# Patient Record
Sex: Female | Born: 1970 | Race: White | Hispanic: No | Smoking: Former smoker
Health system: Southern US, Community
[De-identification: ages and names within clinical notes are randomized; demographics above are authoritative.]

## PROBLEM LIST (undated history)

## (undated) DIAGNOSIS — R51 Headache: Secondary | ICD-10-CM

## (undated) DIAGNOSIS — I739 Peripheral vascular disease, unspecified: Secondary | ICD-10-CM

## (undated) DIAGNOSIS — D689 Coagulation defect, unspecified: Secondary | ICD-10-CM

## (undated) DIAGNOSIS — R32 Unspecified urinary incontinence: Secondary | ICD-10-CM

## (undated) DIAGNOSIS — R519 Headache, unspecified: Secondary | ICD-10-CM

## (undated) DIAGNOSIS — I82409 Acute embolism and thrombosis of unspecified deep veins of unspecified lower extremity: Secondary | ICD-10-CM

## (undated) DIAGNOSIS — K5792 Diverticulitis of intestine, part unspecified, without perforation or abscess without bleeding: Secondary | ICD-10-CM

## (undated) HISTORY — DX: Unspecified urinary incontinence: R32

## (undated) HISTORY — DX: Diverticulitis of intestine, part unspecified, without perforation or abscess without bleeding: K57.92

## (undated) HISTORY — DX: Peripheral vascular disease, unspecified: I73.9

## (undated) HISTORY — PX: EYE SURGERY: SHX253

## (undated) HISTORY — DX: Coagulation defect, unspecified: D68.9

## (undated) HISTORY — PX: VEIN BYPASS SURGERY: SHX833

---

## 2004-11-16 ENCOUNTER — Encounter: Admission: RE | Admit: 2004-11-16 | Discharge: 2004-11-16 | Payer: Self-pay | Admitting: Obstetrics and Gynecology

## 2009-10-25 ENCOUNTER — Ambulatory Visit: Payer: Self-pay | Admitting: Vascular Surgery

## 2009-12-08 ENCOUNTER — Ambulatory Visit: Payer: Self-pay | Admitting: Vascular Surgery

## 2009-12-13 ENCOUNTER — Ambulatory Visit: Payer: Self-pay | Admitting: Vascular Surgery

## 2010-01-24 ENCOUNTER — Ambulatory Visit: Payer: Self-pay | Admitting: Vascular Surgery

## 2010-08-01 ENCOUNTER — Ambulatory Visit: Payer: Self-pay | Admitting: Vascular Surgery

## 2010-08-17 ENCOUNTER — Ambulatory Visit: Payer: Self-pay | Admitting: Vascular Surgery

## 2010-08-21 ENCOUNTER — Ambulatory Visit: Payer: Self-pay | Admitting: Vascular Surgery

## 2010-09-28 ENCOUNTER — Ambulatory Visit
Admission: RE | Admit: 2010-09-28 | Discharge: 2010-09-28 | Payer: Self-pay | Source: Home / Self Care | Attending: Vascular Surgery | Admitting: Vascular Surgery

## 2010-10-16 ENCOUNTER — Ambulatory Visit (INDEPENDENT_AMBULATORY_CARE_PROVIDER_SITE_OTHER): Payer: Medicaid - Out of State | Admitting: Vascular Surgery

## 2010-10-16 ENCOUNTER — Encounter (INDEPENDENT_AMBULATORY_CARE_PROVIDER_SITE_OTHER): Payer: Medicaid - Out of State

## 2010-10-16 DIAGNOSIS — I83893 Varicose veins of bilateral lower extremities with other complications: Secondary | ICD-10-CM

## 2010-10-16 DIAGNOSIS — M79609 Pain in unspecified limb: Secondary | ICD-10-CM

## 2010-10-17 NOTE — Assessment & Plan Note (Signed)
OFFICE VISIT  Shelby, Frederick DOB:  [birth year]                                       10/16/2010 [id number]#:[id number]  This patient returns today continuing to complain of aching up into her right buttock area extending down the right lateral thigh.  She has noticed some prominent veins in this area and is concerned that the venous disease may be the problem.  We performed a laser ablation of the right anterior accessory branch of the great saphenous vein 08/17/2010. She previously had laser ablation of her right great saphenous vein in [place] in the past.  She has not noted any new bulging varicosities. She does not have severe symptoms in the left leg.  PHYSICAL EXAMINATION:  Her blood pressure is 133/87, heart rate is 91, respirations 24.  General:  She is a well-developed obese female who is in no apparent distress, alert and oriented x3.  Abdomen:  Soft, nontender with no masses.  Lower extremity exam:  Reveals 3+ femoral, popliteal, and dorsalis pedis pulses bilaterally.  She does not have any bulging varicosities in the right leg.  She does have a few prominent veins over the right buttock area laterally but not really varicosed.  I ordered a venous duplex exam which I reviewed and interpreted.  She does have some patency of the accessory branch of the right great saphenous vein which we previously closed but no DVT or patency of the right great saphenous vein itself.  I do not think venous disease is causing her current symptoms.  It sounds more like either nerve compression symptoms or orthopedic type joint symptoms.  I think she would best be evaluated by orthopedic surgeon and have asked her to make an appointment with Dr. Annell Greening to evaluate this problem.  I do not think any further venous treatment is indicated and I discussed that at length with her.    [name], M.D. Electronically Signed  JDL/MEDQ  D:  10/16/2010  T:   10/17/2010  Job:  [id number]

## 2010-10-31 NOTE — Procedures (Unsigned)
DUPLEX DEEP VENOUS EXAM - LOWER EXTREMITY  INDICATION:  Increased pain.  HISTORY:  Edema:  No. Trauma/Surgery:  Right anterior saphenous vein laser ablation on 08/17/2010, history of bilateral greater saphenous vein laser ablation. Pain:  Yes. PE:  No. Previous DVT:  Yes. Anticoagulants: Other:  DUPLEX EXAM:               CFV   SFV   PopV  PTV    GSV               R  L  R  L  R  L  R   L  R  L Thrombosis    o  o  o  o  o  o  o   o Spontaneous   +  +  +  +  +  +  +   + Phasic        +  +  +  +  +  +  +   + Augmentation  +  +  +  +  +  +  +   + Compressible  +  +  +  +  +  +  +   + Competent     +  o  +  o  +  +  +   +  Legend:  + - yes  o - no  p - partial  D - decreased  IMPRESSION: 1. No evidence of deep vein thrombosis noted in the bilateral lower     extremities. 2. The bilateral greater saphenous veins were not adequately     visualized.  This is due to patient's history of bilateral greater     saphenous vein laser ablations. 3. The right anterior accessory saphenous vein appears partially     occluded, with reflux of >500 milliseconds, at the proximal to mid     thigh levels.  There is a patent tortuous superficial varicose vein     that communicates with the left anterior accessory vein at the mid     thigh level and courses to the lateral hip region with  retrograde     flow. 4. The left anterior accessory saphenous vein, with a  tortuous     proximal thigh segment, demonstrates reflux of >500 milliseconds. 5. The left common femoral and superficial femoral veins demonstrate     reflux of >500 milliseconds. 6. Additional information and measurements are noted on the attached     work sheet.     _____________________________ Shelby Frederick, M.D.  CH/MEDQ  D:  10/17/2010  T:  10/17/2010  Job:  [id number]

## 2011-01-16 NOTE — Procedures (Signed)
LOWER EXTREMITY VENOUS REFLUX EXAM   INDICATION:  History of thrombophlebitis in right leg, pain.   EXAM:  Using color-flow imaging and pulse Doppler spectral analysis, the  right common femoral, superficial femoral, popliteal, posterior tibial,  greater and lesser saphenous veins were evaluated.  There is no evidence  suggesting deep venous insufficiency in the right lower extremity.   The right saphenofemoral junction is competent.  The right GSV is not  visible and has been previously lasered closed.   The right proximal short saphenous vein is not visualized and has been  previously lasered closed.   The anterior lateral branch noted with focal proximal thigh reflux >500  milliseconds noted with diameters that range from 0.55 to 0.35 cm.   GSV Diameter (used if found to be incompetent only)                                            Right    Left  Proximal Greater Saphenous Vein           cm       cm  Proximal-to-mid-thigh                     cm       cm  Mid thigh                                 cm       cm  Mid-distal thigh                          cm       cm  Distal thigh                              cm       cm  Knee                                      cm       cm   IMPRESSION:  1. The right greater saphenous vein is not visualized and has been      previously lasered closed.  The deep venous system is competent.      The right short saphenous vein is not visualized and has been      previously lasered closed.  2. Anterior lateral branch noted with focal proximal thigh reflux >500      milliseconds with diameters as noted above.  Nonocclusive      superficial phlebitis noticed in the distal thigh.         ___________________________________________  Quita Skye. Hart [place], M.D.   NT/MEDQ  D:  08/01/2010  T:  08/01/2010  Job:  [id number]

## 2011-01-16 NOTE — Procedures (Signed)
DUPLEX DEEP VENOUS EXAM - LOWER EXTREMITY   INDICATION:  Rule out thrombophlebitis.   HISTORY:  Edema:  yes  Trauma/Surgery:  The patient fell on 12/07/2009  Pain:  yes  PE:  no  Previous DVT:  Left leg  Anticoagulants:  no  Other:   DUPLEX EXAM:                CFV   SFV   PopV  PTV    GSV                R  L  R  L  R  L  R   L  R         L  Thrombosis    0  0  0     0     0      Stripped spontaneous  Spontaneous   +  +  +  +  +  +  +   +  +         +  Phasic        +  +  +  +  +  +  +   +  +         +  Augmentation  Compressible  Competent   Legend:  + - yes  o - no  p - partial  D - decreased   IMPRESSION:  There does not appear to be any deep vein thrombus noted in  the right leg.  There does appear to be thrombus noted in the lateral  branch off of the right saphenous vein origin.  It was thrombosed at  midthigh level to the knee level.    _____________________________  Di Kindle. Edilia Bo, M.D.   CB/MEDQ  D:  12/09/2009  T:  12/09/2009  Job:  [id number]

## 2011-01-16 NOTE — Consult Note (Signed)
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  Her  venous history began 6 years ago when she developed thrombophlebitis in  the right calf during her pregnancy.  This resolved.  Following the  pregnancy she developed varicose veins in the right leg which worsened  and in 2009 she developed a second episode of thrombophlebitis in the  right posterior calf, was treated with Coumadin for 6 months and that  resolved.  She has had painful varicosities which were treated in  [place], [place], with laser ablation of both great saphenous veins in  March of 2010.  She states that the pain in the right leg persisted and  that she developed a deep clot in the left femoral vein which required a  hospitalization and conversion to Lovenox and Coumadin therapy which was  continued for 3 months.  She then had evaluation at [facility]  where she was found to have reflux in her right small saphenous vein and  had a laser ablation performed by Dr. Margit Hanks in August of 2010.  She  continues to have painful varicosities in the right thigh anteriorly  medially which cause aching, burning and throbbing discomfort as she is  on her feet.  She has swelling in the right ankle as the day progresses  and her symptoms worsen and she develops hypersensitivity below the knee  and down to the ankle area.  She has had no deep venous thrombosis in  the right leg.  No bleeding, no ulceration, does wear short leg  compression stockings and elevate her legs periodically.   PAST MEDICAL HISTORY:  Chronic medical problems none.  Negative for  diabetes, coronary artery disease, hypertension, COPD or stroke.   FAMILY HISTORY:  Positive for coronary artery disease in two  grandfathers, negative for diabetes and stroke.   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  Does not use alcohol.   REVIEW OF SYSTEMS:  Positive for leg discomfort with walking and history  of DVT and phlebitis as noted before.  No chest pain, dyspnea on  exertion, PND, orthopnea.  No GI or GU symptoms.  All other systems  negative on review of systems.   PHYSICAL EXAMINATION:  Vital signs:  Blood pressure 128/83, heart rate  90, respirations 14.  General:  She is a well-developed, well-nourished  obese female who is in no apparent distress.  She is alert and oriented  x3.  HEENT:  EOMs intact.  Conjunctive normal.  Neck:  Supple, 3+  carotid pulses.  No bruits.  Lungs:  Clear to auscultation.  Cardiovascular:  Regular rhythm.  No murmurs.  Abdomen:  Soft, nontender  with no masses.  Musculoskeletal:  Free of major deformities.  Neurological:  Normal.  Skin:  Free of rashes.  She has bulging  varicosities in the right leg in the mid to distal thigh anteriorly and  extending medially down to the knee with 1+ edema in the right ankle.  No varicosities noted  in the left leg.   I ordered a venous duplex exam today which I reviewed and interpreted.  This reveals that the right great saphenous vein has been totally closed  but there is a large lateral accessory branch which runs from the  saphenofemoral junction to the knee, which does have gross reflux  feeding these varicosities.  Left leg has a similar branch extending  down about halfway down the thigh with some reflux.  There is no reflux  in the small saphenous systems bilaterally and the deep systems are  normal with no DVT.   I think the best plan for this lady would be to treat her conservatively  for 3 months with long leg elastic compression stockings, elevation,  ibuprofen on a daily basis.  If she has had no improvement we  should  proceed with laser ablation of this lateral accessory branch with  multiple stab phlebectomies.  She will return in 3 months.     [name], M.D.  Electronically Signed   JDL/MEDQ  D:  10/25/2009  T:  10/26/2009  Job:  [id number]

## 2011-01-16 NOTE — Assessment & Plan Note (Signed)
OFFICE VISIT   Shelby Frederick, Shelby Frederick  DOB:  1971-01-24                                       08/01/2010  HYQMV#:[id number]   The patient returns today with worsening symptoms from her venous  insufficiency in the right leg.  Previously she had been known to have  severe reflux in the lateral anterior accessory branch of the right  great saphenous vein and was prepared for laser ablation when she  developed thrombophlebitis and that thrombosed in April of this year.  She now has been wearing long-leg elastic compression stockings but has  worsening aching, throbbing and burning discomfort along the medial  thigh.  She states she has had two episodes of superficial  thrombophlebitis since we saw her in May which she has treated with warm  compresses and ibuprofen but continues to have symptoms.  She also  elevates her leg and takes ibuprofen on a regular basis.   On examination today she has some tender varicosities in the medial  aspect of the distal right thigh and some tender reticular veins  associated with this area.  She has 1+ edema in the lower leg.  She has  a 3+ dorsalis pedis pulse palpable.   Today I ordered venous duplex exam which I reviewed and interpreted.  I  performed a bedside SonoSite ultrasound exam.  She has recanalized her  anterior accessory branch of the right great saphenous vein which has  clear reflux from the junction down to the mid thigh level communicating  with these tender areas.  Her deep system is normal.   I think this patient should have:  Laser ablation of her right lateral  accessory branch of the great saphenous vein which is responsible for  the painful varicosities to be followed by one course of sclerotherapy  in the distal thigh.  We will proceed with this in the near future to  try to relieve the symptoms of this patient whose symptoms are currently  affecting her daily living despite conservative measures.  Blood  pressure today is 122/80, heart rate 78, respirations 14.     Quita Skye. Hart [place], M.D.  Electronically Signed   JDL/MEDQ  D:  08/01/2010  T:  08/01/2010  Job:  [id number]

## 2011-01-16 NOTE — Procedures (Signed)
DUPLEX DEEP VENOUS EXAM - LOWER EXTREMITY   INDICATION:  Follow up right lower extremity laser ablation.   HISTORY:  Edema:  No.  Trauma/Surgery:  Right anterior accessory saphenous laser ablation on  08/17/2010, history of prior right lower extremity vein procedure.  Pain:  Yes.  PE:  No.  Previous DVT:  Yes.  Anticoagulants:  Other:   DUPLEX EXAM:                CFV   SFV   PopV  PTV    GSV                R  L  R  L  R  L  R   L  R  L  Thrombosis    o  o  o     o     o      +  Spontaneous   +  +  +     +     +      o  Phasic        +  +  +     +     +      o  Augmentation  +  +  +     +     +      o  Compressible  +  +  +     +     +      o  Competent     +  o  +     o     +      o   Legend:  + - yes  o - no  p - partial  D - decreased   IMPRESSION:  1. No evidence of deep venous thrombosis noted in the right lower      extremity.  2. The right greater saphenous vein demonstrates evidence of a past      laser ablation.  3. The right proximal thigh anterior accessory vein appears to be      totally occluded from the mid thigh to near the saphenofemoral      junction.  There is a patent branch with retrograde flow noted at      the right mid thigh level anterior accessory saphenous vein which      appears to possibly communicate with a superior branch near the      saphenofemoral junction, as described on the attached work sheet.    _____________________________  Quita Skye. Hart [place], M.D.   CH/MEDQ  D:  08/22/2010  T:  08/22/2010  Job:  [id number]

## 2011-01-16 NOTE — Assessment & Plan Note (Signed)
OFFICE VISIT   KORENE, DULA  DOB:  [birth year]                                       08/21/2010  JYNWG#:[id number]   The patient returns for followup regarding her recent laser ablation  procedure performed December 15.  She had previously had her great  saphenous vein closed with laser ablation 2 years ago in [place] and had  her right anterior accessory great saphenous vein closed by [place] 1 week  ago.  There was a proximal segment which was feeding the varicosities in  the thigh.  She has had some moderate discomfort along the course of the  lateral and anterior accessory branch and she states that this has been  radiating into the hip area.  She has been taking ibuprofen, wearing her  long-leg elastic compression stocking and trying ice compression.  She  has had no distal edema in the right leg.   PHYSICAL EXAMINATION:  On examination today blood pressure is 112/80,  heart rate 64, respirations 24.  She has some mild to moderate  discomfort along the course of the anterior accessory branch.  There is  no ecchymosis or hematoma noted.  She has 3+ dorsalis pedis pulse with  no edema distally.   Today I ordered a venous duplex exam which I reviewed and interpreted.  There is no evidence of deep venous obstruction in the right leg.  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  There are some  collateral branches around the saphenofemoral junction extending out  laterally which do have reflux but are not in a long enough segment to  close with laser.   I reassured her regarding these findings.  She will return in about 6  weeks for her course of sclerotherapy to conclude her treatment session.     [name], M.D.  Electronically Signed   JDL/MEDQ  D:  08/21/2010  T:  08/21/2010  Job:  [id number]

## 2011-01-16 NOTE — Procedures (Signed)
LOWER EXTREMITY VENOUS REFLUX EXAM   INDICATION:  Bilateral legs varicose veins with pain and swelling.   EXAM:  Using color-flow imaging and pulse Doppler spectral analysis, the  bilateral common femoral, superficial femoral, popliteal, posterior  tibial, greater and lesser saphenous veins are evaluated.  There is no  evidence suggesting deep venous insufficiency in the right and left  lower extremity.   The right saphenofemoral junction is not competent with reflux of > 500  milliseconds.  The right and left greater saphenous vein appears  ablated.  The right and left saphenofemoral junction appears ablated.   GSV Diameter (used if found to be incompetent only)                                            Right    Left  Proximal Greater Saphenous Vein           cm       cm  Proximal-to-mid-thigh                     cm       cm  Mid thigh                                 cm       cm  Mid-distal thigh                          cm       cm  Distal thigh                              cm       cm  Knee                                      cm       cm   IMPRESSION:  1. Right and left greater saphenous vein appears ablated.  Bilateral      greater saphenous vein lateral branch appears to have reflux of      >500 milliseconds.  2. The right and left greater saphenous vein is not aneurysmal.  3. The right and left greater saphenous veins are not tortuous.  4. The deep venous system is competent.  5. The right and left lesser saphenous veins appear ablated.  6. No evidence of deep venous thrombosis noted in bilateral legs.   Please see attached 4 drawings.         ___________________________________________  [name], M.D.   MG/MEDQ  D:  10/25/2009  T:  10/26/2009  Job:  [id number]

## 2011-01-16 NOTE — Assessment & Plan Note (Signed)
OFFICE VISIT   Shelby, Frederick  DOB:  [birth year]                                       01/24/2010  QMVHQ#:[id number]   The patient returns today for further followup regarding her venous  insufficiency of the right leg.  She was found to have thrombosis of the  lateral branch which we had planned for laser ablation.  This was  discovered at the last visit on April 12, therefore eliminating the need  for the laser treatment.  She does not have any bulging varicosities  that are in need of stab phlebectomies at this time.  Her right leg does  feel better since this is thrombosed but she does have some aching and  throbbing discomfort in the calf and thigh.   I have recommended she wear elastic compression stockings, try elevation  of the legs and take ibuprofen for this.  She also has some early  varicosities in the posterior left calf area.  We evaluated these today.  They are causing some aching, throbbing and burning discomfort.  She has  a palpable dorsalis pedis pulse in the left foot.  She has no greater  saphenous varicosities anteriorly.  Venous duplex today revealed deep  system on the left to be normal with some reflux in the lateral of the  great saphenous vein although the vein is not very large.  I recommended  primary sclerotherapy for this.  If she should decide she would like to  proceed with this she will be in touch with [place] otherwise return on a  p.r.n. basis.     [name], M.D.  Electronically Signed   JDL/MEDQ  D:  01/24/2010  T:  01/25/2010  Job:  [id number]

## 2011-01-16 NOTE — Procedures (Signed)
LOWER EXTREMITY VENOUS REFLUX EXAM   INDICATION:  Painful varicosities.   EXAM:  Using color-flow imaging and pulse Doppler spectral analysis, the  left common femoral, superficial femoral, popliteal, posterior tibial,  greater and lesser saphenous veins are evaluated.  There is no evidence  suggesting deep venous insufficiency in the left lower extremity.   The left saphenofemoral junction is competent. The left GSV is ablated.   The left proximal short saphenous vein demonstrates competency.   GSV Diameter (used if found to be incompetent only)                                            Right    Left  Proximal Greater Saphenous Vein           cm       cm  Proximal-to-mid-thigh                     cm       cm  Mid thigh                                 cm       cm  Mid-distal thigh                          cm       cm  Distal thigh                              cm       cm  Knee                                      cm       cm   IMPRESSION:  1. The left greater saphenous vein appears ablated.  The left lateral      branch appears to have reflux of >500 milliseconds.  The calibers      range from 0.33 to 0.42 cm/s.  2. The deep venous system is competent.  3. The left lesser saphenous veins appear ablated.  Please see      attached drawing.   ___________________________________________  Quita Skye. Hart [place], M.D.   CB/MEDQ  D:  01/24/2010  T:  01/24/2010  Job:  [id number]

## 2011-01-16 NOTE — Assessment & Plan Note (Signed)
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  Her  venous history as regarding her varicosities is very well documented in  Dr. Candie Chroman most recent note.  He has plans to treat her conservatively  for the next 3 months with long-leg elastic compression stockings,  elevation, and ibuprofen on a daily basis.  Plans are that if she has no  improvement, we would proceed with laser ablation of a lateral accessory  branch of multiple stab phlebectomies.  She was scheduled to return in 3  months.   On 12/07/2009, she slipped and fell.  Since that time, she has  complained of increasing redness and inflammation and warmth of the  anterior right thigh.  She contacted our office and requested a visit.   Review of systems is significant for leg discomfort.  All other  interrogatories were negative.   Physical findings revealed a very well-nourished female in no apparent  distress.  HEENT:  PERRLA, EOMI with normal conjunctivae.  Mucous  membranes were pink and moist.  Lungs were clear.  Cardiac exam revealed  a regular rate and rhythm.  The abdomen was soft, nontender.  Musculoskeletal examination revealed no major deformities.  Neurological  exam revealed no focal weaknesses.  Heart rate was 92.  Blood pressure  was initially 154/102.  At the end of the visit, it was down to 150/94.  O2 sat was 98%.   I did evaluate the right lower extremity.  On the anterior portion of  the thigh, there was a very small area which measured approximately 10  cm x 2 cm, which did appear somewhat erythematous.  In this area, there  was a venous cord which was appreciable.   LABORATORY WORK:  She did undergo a venous Doppler, which demonstrated  no deep vein  thrombosis noted in the right leg.  There did appear to be  a thrombosis noted in the lateral branch off the right greater saphenous  vein, which became thrombosed mid thigh level to the knee level.   These findings were discussed with Dr. Edilia Bo.  We felt that this did  not represent a life-threatening problem.  We felt that this would be  best treated with conservative measures, i.e., leg elevation, warm  compresses, and ibuprofen.   I did discuss this treatment plan with patient.  She does agree to  conservative treatment at this time.  I also gave her a note excusing  her from work for the next several days, specifically 04/07 through  04/011, so that she could elevate the extremity and maintained warm  compresses.   She was further informed that if this did not resolve or if she had  further questions, to call and we would be happy to either discuss the  questions with her or see her again.   Wilmon Arms, PA   Di Kindle. Edilia Bo, M.D.  Electronically Signed   KEL/MEDQ  D:  12/08/2009  T:  12/08/2009  Job:  [id number]

## 2011-01-16 NOTE — Assessment & Plan Note (Signed)
OFFICE VISIT   Shelby Frederick, Shelby Frederick  DOB:  May 08, 1971                                       08/17/2010  [id number]#:[id number]   The patient had laser ablation of the lateral accessory branch of the  right great saphenous vein performed under local anesthesia today for  recurrent painful varicosities.  She has never had previous procedures  done in this facility but has had at [facility].  She had reflux in the  proximal lateral accessory  branch.  I was unable to cannulate the  accessory branch in the distal to mid-thigh but was able to cannulate it  proximally.  She tolerated the procedure well and will return in 1 week  for venous duplex exam to check on closure of this lateral accessory  branch.     Shelby Frederick, M.D.  Electronically Signed   JDL/MEDQ  D:  08/17/2010  T:  08/18/2010  Job:  [id number]

## 2011-01-16 NOTE — Assessment & Plan Note (Signed)
OFFICE VISIT   TREINA, ARSCOTT  DOB:  08/09/1971                                       12/13/2009  [id number]#:[id number]   The patient returns again today for further evaluation of her acute  thrombophlebitis of the right leg.  I had seen her in February with a  plan to perform ablation of her right lateral accessory branch of the  great saphenous vein which is feeding painful varicosities in her distal  thigh.  She has previously undergone laser ablation in [place] of both  great saphenous veins and at [facility] of the right small saphenous vein.  However, she developed thrombophlebitis in the right lateral accessory  branch last week and thrombosed that branch and she now has resolving  thrombophlebitis in the right thigh.  She has not worked in 12 months  because of her venous problems with the right leg.  She has been wearing  elastic compression stockings.   On exam today she does have some resolving thrombophlebitis in the right  thigh extending anteriorly and down to the knee level medially.  No  tenderness in the calf.  She had a venous duplex exam performed last  week in the office which I have reviewed today.  She has no deep venous  thrombosis.  She does have thrombosis of the lateral branch of the right  great saphenous vein which involved the distal portion last week.  Today  I performed the bedside SonoSite exam which revealed total occlusion and  thrombosis of the lateral accessory branch up to the saphenofemoral  junction.   There is no flow in the branch which we were planning to perform  ablation on, and therefore no treatment could be done at this time.  I  have recommended she continue with heat, elevation and ibuprofen for  this thrombophlebitis, let it resolve and return to work with elastic  compression stockings.  If she develops further bulging varicosities in  the future we could have her return to recheck this to see if this has  recanalized in the great saphenous system and I discussed that at length  with her today.     [name], M.D.  Electronically Signed   JDL/MEDQ  D:  12/13/2009  T:  12/14/2009  Job:  [id number]

## 2012-06-27 ENCOUNTER — Telehealth: Payer: Self-pay

## 2012-06-27 ENCOUNTER — Encounter (HOSPITAL_COMMUNITY): Payer: Self-pay | Admitting: Emergency Medicine

## 2012-06-27 ENCOUNTER — Emergency Department (HOSPITAL_COMMUNITY)
Admission: EM | Admit: 2012-06-27 | Discharge: 2012-06-27 | Disposition: A | Discharge status: Home / Self Care | Payer: Self-pay | Attending: Emergency Medicine | Admitting: Emergency Medicine

## 2012-06-27 DIAGNOSIS — I82509 Chronic embolism and thrombosis of unspecified deep veins of unspecified lower extremity: Secondary | ICD-10-CM | POA: Insufficient documentation

## 2012-06-27 DIAGNOSIS — M79606 Pain in leg, unspecified: Secondary | ICD-10-CM

## 2012-06-27 DIAGNOSIS — Z86718 Personal history of other venous thrombosis and embolism: Secondary | ICD-10-CM | POA: Insufficient documentation

## 2012-06-27 DIAGNOSIS — F17200 Nicotine dependence, unspecified, uncomplicated: Secondary | ICD-10-CM | POA: Insufficient documentation

## 2012-06-27 DIAGNOSIS — I82409 Acute embolism and thrombosis of unspecified deep veins of unspecified lower extremity: Secondary | ICD-10-CM

## 2012-06-27 HISTORY — DX: Acute embolism and thrombosis of unspecified deep veins of unspecified lower extremity: I82.409

## 2012-06-27 LAB — BASIC METABOLIC PANEL
CO2: 27 mEq/L (ref 19–32)
Calcium: 9.6 mg/dL (ref 8.4–10.5)
Glucose, Bld: 90 mg/dL (ref 70–99)
Potassium: 3.6 mEq/L (ref 3.5–5.1)

## 2012-06-27 LAB — PROTIME-INR: INR: 0.92 (ref 0.00–1.49)

## 2012-06-27 LAB — CBC
HCT: 42.5 % (ref 36.0–46.0)
MCHC: 33.9 g/dL (ref 30.0–36.0)
Platelets: 295 10*3/uL (ref 150–400)
RDW: 13.3 % (ref 11.5–15.5)
WBC: 8.9 10*3/uL (ref 4.0–10.5)

## 2012-06-27 MED ORDER — OXYCODONE-ACETAMINOPHEN 5-325 MG PO TABS
2.0000 | ORAL_TABLET | Freq: Once | ORAL | Status: AC
  Administered 2012-06-27: 2 via ORAL
  Filled 2012-06-27: qty 2

## 2012-06-27 MED ORDER — ENOXAPARIN SODIUM 150 MG/ML ~~LOC~~ SOLN: 1.0000 mg/kg | Freq: Once | SUBCUTANEOUS | Status: DC

## 2012-06-27 MED ORDER — ENOXAPARIN SODIUM 80 MG/0.8ML ~~LOC~~ SOLN
80.0000 mg | Freq: Once | SUBCUTANEOUS | Status: AC
  Administered 2012-06-27: 80 mg via SUBCUTANEOUS
  Filled 2012-06-27: qty 0.8

## 2012-06-27 NOTE — Telephone Encounter (Signed)
Discussed w/ Dr. Imogene Burn.  States pt. needs to be seen by her medical doctor to evaluate her symptoms.  Advised pt. to report symptoms to her PCP.  Verb. Understanding.

## 2012-06-27 NOTE — Telephone Encounter (Signed)
Phone call from pt.  Reports onset of sensation of bee-stings on inner aspect of lower right leg and felt toes drawing-up, during night.   States swelling from the right foot to knee.  States she feels "a lot of pressure".  Reports hx. Of blood clots. Denies taking any anticoagulants at this time.   Will discuss w/ Dr. Imogene Burn.

## 2012-06-27 NOTE — ED Notes (Signed)
Right knee pain for 2 weeks, around 3  am and right toes curled up and felt a sharp pain all over right leg. After that the right calf stated swelling up and area warm to touch.

## 2012-06-27 NOTE — ED Notes (Addendum)
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 2011, sees Dr Hart Rochester with [facility]; pt not currently on blood thinners; strong DP pulse; denies SOB; reports new knot to R knee

## 2012-06-27 NOTE — ED Provider Notes (Signed)
History     CSN: [id number]  Arrival date & time 06/27/12  1839   First MD Initiated Contact with Patient 06/27/12 2044      Chief Complaint  Patient presents with  . Possible DVT     (Consider location/radiation/quality/duration/timing/severity/associated sxs/prior treatment) HPI Comments: 41 y/o female with history of DVT in 2011 presents to the ED complaining of sudden onset right calf pain radiating throughout her leg as a "burning and tingling" sensation. Pain rated 12/10 worse with walking and palpation. No relief with ibuprofen. Admits to mild right leg swelling. States this feels the same as her previous DVT. She is not on any blood thinners at this time. Denies fever, chills, nausea, chest pain, sob, palpitations. Denies any recent long car rides or plane rides. Admits to being a smoker.   The history is provided by the patient.    Past Medical History  Diagnosis Date  . DVT (deep venous thrombosis)     Past Surgical History  Procedure Date  . Vein bypass surgery     History reviewed. No pertinent family history.  History  Substance Use Topics  . Smoking status: Current Every Day Smoker -- 0.5 packs/day    Types: Cigarettes  . Smokeless tobacco: Not on file  . Alcohol Use: No    OB History    Grav Para Term Preterm Abortions TAB SAB Ect Mult Living                  Review of Systems  Constitutional: Negative for fever, chills and diaphoresis.  HENT: Negative for neck pain.   Respiratory: Negative for shortness of breath.   Cardiovascular: Negative for chest pain and palpitations.  Gastrointestinal: Negative for nausea and vomiting.  Musculoskeletal: Positive for myalgias.  Skin: Negative for color change.  Neurological: Negative for weakness and numbness.  Hematological: Does not bruise/bleed easily.  Psychiatric/Behavioral: The patient is not nervous/anxious.     Allergies  Review of patient's allergies indicates no known allergies.  Home  Medications   Current Outpatient Rx  Name Route Sig Dispense Refill  . IBUPROFEN 200 MG PO TABS Oral Take 400 mg by mouth every 6 (six) hours as needed. For pain/fever      BP 155/95  Pulse 102  Temp 98.1 F (36.7 C) (Oral)  Resp 18  SpO2 97%  LMP 06/13/2012  Physical Exam  Constitutional: She is oriented to person, place, and time. She appears well-developed and well-nourished. No distress.  HENT:  Head: Normocephalic and atraumatic.  Eyes: Conjunctivae normal and EOM are normal. Pupils are equal, round, and reactive to light.  Neck: Normal range of motion. Neck supple.  Cardiovascular: Normal rate, regular rhythm, normal heart sounds and intact distal pulses.   Pulses:      Dorsalis pedis pulses are 2+ on the right side, and 2+ on the left side.       Posterior tibial pulses are 2+ on the right side, and 2+ on the left side.       +1 pitting edema right lower leg. Positive Homan's.  Pulmonary/Chest: Effort normal and breath sounds normal. No respiratory distress.  Abdominal: Soft. Bowel sounds are normal. There is no tenderness.  Neurological: She is alert and oriented to person, place, and time. No sensory deficit.  Skin: Skin is warm and dry. No erythema.  Psychiatric: She has a normal mood and affect. Her speech is normal and behavior is normal.    ED Course  Procedures (including critical care  time)  Labs Reviewed - No data to display No results found.   1. Deep vein thrombosis   2. Leg pain       MDM  41 y/o female with probable right leg DVT. Ultrasound is not available at this time of night. Patient given Lovenox and aware to return in the morning for her ultrasound. Pain controlled with percocet. Case discussed with Dr. Adriana Simas who agrees with plan of care.        Trevor Mace, PA-C 06/27/12 2153

## 2012-06-28 ENCOUNTER — Ambulatory Visit (HOSPITAL_COMMUNITY)
Admission: RE | Admit: 2012-06-28 | Discharge: 2012-06-28 | Disposition: A | Discharge status: Home / Self Care | Payer: Self-pay | Source: Ambulatory Visit | Attending: Emergency Medicine | Admitting: Emergency Medicine

## 2012-06-28 DIAGNOSIS — M7989 Other specified soft tissue disorders: Secondary | ICD-10-CM | POA: Insufficient documentation

## 2012-06-28 DIAGNOSIS — M79609 Pain in unspecified limb: Secondary | ICD-10-CM | POA: Insufficient documentation

## 2012-06-28 NOTE — Progress Notes (Signed)
VASCULAR LAB PRELIMINARY  PRELIMINARY  PRELIMINARY  PRELIMINARY  Right lower extremity venous Doppler completed.    Preliminary report:  There is no DVT or SVT noted in the right lower extremity.  Sluggish flow noted in the popliteal vein, etiology unknown.  Roddy Bellamy, 06/28/2012, 9:28 AM

## 2012-06-30 NOTE — ED Provider Notes (Signed)
Medical screening examination/treatment/procedure(s) were conducted as a shared visit with non-physician practitioner(s) and myself.  I personally evaluated the patient during the encounter.  Right calf tenderness. No chest pain or shortness of breath. Subcutaneous Lovenox. Doppler study in morning.  Donnetta Hutching, MD 06/30/12 [phone]

## 2013-01-16 ENCOUNTER — Telehealth: Payer: Self-pay

## 2013-01-16 DIAGNOSIS — I8311 Varicose veins of right lower extremity with inflammation: Secondary | ICD-10-CM

## 2013-01-16 DIAGNOSIS — M79609 Pain in unspecified limb: Secondary | ICD-10-CM

## 2013-01-16 DIAGNOSIS — M7989 Other specified soft tissue disorders: Secondary | ICD-10-CM

## 2013-01-16 NOTE — Telephone Encounter (Signed)
Pt. Called to report onset of discomfort in right lower leg on Tuesday, 5/13, that "felt like a bee sting."  Stated that her "right lower leg felt numb, and had burning in toes."   Since Tuesday, has noticed increase in redness with tenderness in right lower leg.  States "you can see the veins sticking out".  Relates hx of superficial blood clots.  States has swelling from right foot up to knee.  Has been using compression hose, elevating leg, and taking Ibuprofen.  Discussed with Dr. Imogene Burn.  Advises to schedule for a right lower extremity venous duplex next week.  Enc. to continue to use compression, elevation, and Ibuprofen for symptoms.  Appt. given for 11:30 AM 01/19/13.  Pt. aware of appt.  Advised if symptoms worsen over the weekend, should go to the ER.  Verb. Understanding.

## 2013-02-09 ENCOUNTER — Encounter (HOSPITAL_COMMUNITY): Payer: Self-pay | Admitting: Emergency Medicine

## 2013-02-09 ENCOUNTER — Emergency Department (HOSPITAL_COMMUNITY)
Admission: EM | Admit: 2013-02-09 | Discharge: 2013-02-09 | Disposition: A | Discharge status: Home / Self Care | Payer: Medicaid - Out of State | Attending: Emergency Medicine | Admitting: Emergency Medicine

## 2013-02-09 DIAGNOSIS — Z79899 Other long term (current) drug therapy: Secondary | ICD-10-CM | POA: Insufficient documentation

## 2013-02-09 DIAGNOSIS — I82409 Acute embolism and thrombosis of unspecified deep veins of unspecified lower extremity: Secondary | ICD-10-CM | POA: Insufficient documentation

## 2013-02-09 DIAGNOSIS — I82401 Acute embolism and thrombosis of unspecified deep veins of right lower extremity: Secondary | ICD-10-CM

## 2013-02-09 DIAGNOSIS — F17200 Nicotine dependence, unspecified, uncomplicated: Secondary | ICD-10-CM | POA: Insufficient documentation

## 2013-02-09 LAB — POCT I-STAT, CHEM 8
BUN: 14 mg/dL (ref 6–23)
Calcium, Ion: 1.17 mmol/L (ref 1.12–1.23)
Chloride: 106 mEq/L (ref 96–112)
Creatinine, Ser: 0.8 mg/dL (ref 0.50–1.10)
Glucose, Bld: 88 mg/dL (ref 70–99)
HCT: 42 % (ref 36.0–46.0)
Hemoglobin: 14.3 g/dL (ref 12.0–15.0)
Potassium: 3.7 mEq/L (ref 3.5–5.1)
Sodium: 142 mEq/L (ref 135–145)
TCO2: 28 mmol/L (ref 0–100)

## 2013-02-09 LAB — CBC
HCT: 41.5 % (ref 36.0–46.0)
Hemoglobin: 13.6 g/dL (ref 12.0–15.0)
MCHC: 32.8 g/dL (ref 30.0–36.0)
RBC: 4.72 MIL/uL (ref 3.87–5.11)

## 2013-02-09 LAB — PROTIME-INR: INR: 0.86 (ref 0.00–1.49)

## 2013-02-09 MED ORDER — ENOXAPARIN SODIUM 100 MG/ML ~~LOC~~ SOLN
1.0000 mg/kg | Freq: Once | SUBCUTANEOUS | Status: AC
  Administered 2013-02-09: 85 mg via SUBCUTANEOUS
  Filled 2013-02-09: qty 1

## 2013-02-09 MED ORDER — HYDROMORPHONE HCL PF 1 MG/ML IJ SOLN
1.0000 mg | Freq: Once | INTRAMUSCULAR | Status: AC
  Administered 2013-02-09: 1 mg via INTRAMUSCULAR
  Filled 2013-02-09: qty 1

## 2013-02-09 MED ORDER — ENOXAPARIN SODIUM 100 MG/ML ~~LOC~~ SOLN: 1.0000 mg/kg | Freq: Two times a day (BID) | SUBCUTANEOUS | Status: DC

## 2013-02-09 NOTE — ED Notes (Signed)
Pt presenting to ed with c/o right leg with blood clot pt states ongoing x 2 years but she is having worsening pain and swelling today. Pt states her pcp is on vacation this week

## 2013-02-09 NOTE — Progress Notes (Signed)
*  Preliminary Results* Right lower extremity venous duplex completed. Right lower extremity is positive for deep vein thrombosis involving the right peroneal veins. There also appears to be a patent greater saphenous vein from the saphenofemoral junction to the distal thigh, with thrombosis involving a superficial vein of the popliteal fossa as well as a superficial vein of the mid posterior calf. Due to the patient's verbal history of venous ablation, it is uncertain as to whether these are thrombosed varicosities versus greater/lesser saphenous vein thrombosis.  Preliminary results discussed with Johnnette Gourd, PA.  02/09/2013 8:16 PM Gertie Fey, RDMS, RDCS

## 2013-02-09 NOTE — ED Notes (Signed)
Updated patient about when vascular tech was coming to do doppler.  Tech coming from [place], only one covering all hosptials.

## 2013-02-09 NOTE — ED Provider Notes (Signed)
History     CSN: [id number]  Arrival date & time 02/09/13  1508   First MD Initiated Contact with Patient 02/09/13 1626      Chief Complaint  Patient presents with  . r/o blood clot    (Consider location/radiation/quality/duration/timing/severity/associated sxs/prior treatment) HPI Comments: 42 year old female with a past medical history of DVT in the bypass surgery presents to the emergency department complaining of right leg pain x1 month, worsening over the past week. She tried calling Dr. Hart Rochester who is her "vein doctor", however he is on vacation. Pain is severe, constant rated 10 out of 10, worse with walking. It is making her nervous. States her leg appears swollen. Denies chest pain, shortness of breath, palpitations, fever or chills. States this feels exactly the same as when she had a blood clot 2 years ago which is in the same leg. She was on warfarin at that time for 6 months.  The history is provided by the patient.    Past Medical History  Diagnosis Date  . DVT (deep venous thrombosis)     Past Surgical History  Procedure Laterality Date  . Vein bypass surgery      No family history on file.  History  Substance Use Topics  . Smoking status: Current Every Day Smoker -- 0.50 packs/day    Types: Cigarettes  . Smokeless tobacco: Not on file  . Alcohol Use: No    OB History   Grav Para Term Preterm Abortions TAB SAB Ect Mult Living                  Review of Systems  Constitutional: Negative for fever and chills.  Respiratory: Negative for shortness of breath.   Cardiovascular: Positive for leg swelling. Negative for chest pain.  Musculoskeletal:       Positive for right leg pain.  Psychiatric/Behavioral: The patient is nervous/anxious.   All other systems reviewed and are negative.    Allergies  Review of patient's allergies indicates no known allergies.  Home Medications   Current Outpatient Rx  Name  Route  Sig  Dispense  Refill  .  indomethacin (INDOCIN) 50 MG capsule   Oral   Take 50 mg by mouth 3 (three) times daily with meals.         Marland Kitchen oxyCODONE-acetaminophen (PERCOCET/ROXICET) 5-325 MG per tablet   Oral   Take 1 tablet by mouth every 4 (four) hours as needed for pain.         . traMADol (ULTRAM) 50 MG tablet   Oral   Take 50 mg by mouth every 8 (eight) hours as needed for pain.           BP 106/66  Pulse 104  Temp(Src) 98.8 F (37.1 C) (Oral)  Resp 18  SpO2 96%  LMP 01/19/2013  Physical Exam  Nursing note and vitals reviewed. Constitutional: She is oriented to person, place, and time. She appears well-developed and well-nourished. No distress.  HENT:  Head: Normocephalic and atraumatic.  Mouth/Throat: Oropharynx is clear and moist.  Eyes: Conjunctivae and EOM are normal. Pupils are equal, round, and reactive to light.  Neck: Normal range of motion. Neck supple.  Cardiovascular: Regular rhythm, normal heart sounds and intact distal pulses.  Tachycardia present.   Pulses:      Dorsalis pedis pulses are 2+ on the right side.       Posterior tibial pulses are 2+ on the right side.  Pulmonary/Chest: Effort normal and breath sounds normal. No  respiratory distress.  Musculoskeletal: Normal range of motion.       Legs: Right calf tender to palpation throughout, extending into popliteal space and distal posterior leg. Right calf edematous, erythema posteriorly, multiple varicose veins palpable.  Neurological: She is alert and oriented to person, place, and time. She has normal strength. No sensory deficit.  Skin: Skin is warm and dry. She is not diaphoretic.  Psychiatric: She has a normal mood and affect. Her behavior is normal.    ED Course  Procedures (including critical care time)  Labs Reviewed  CBC - Abnormal; Notable for the following:    WBC 11.0 (*)    All other components within normal limits  PROTIME-INR  POCT I-STAT, CHEM 8   No results found.   1. Deep vein thrombosis (DVT),  right       MDM  Possible DVT- obtaining duplex US lower extremity.   Duplex ultrasound results- Right lower extremity is positive for deep vein thrombosis involving the right peroneal veins. There also appears to be a patent greater saphenous vein from the saphenofemoral junction to the distal thigh, with thrombosis involving a superficial vein of the popliteal fossa as well as a superficial vein of the mid posterior calf. Due to the patient's verbal history of venous ablation, it is uncertain as to whether these are thrombosed varicosities versus greater/lesser saphenous vein thrombosis.  First dose of lovenox given in ED, discharged with a prescription for same. She will follow up with Dr. Hart Rochester. Return precautions discussed. Patient states understanding of plan and is agreeable.   Trevor Mace, PA-C 02/10/13 [phone]

## 2013-02-10 ENCOUNTER — Telehealth: Payer: Self-pay | Admitting: *Deleted

## 2013-02-10 NOTE — Telephone Encounter (Signed)
Patient called stating that she had been to the ED 02/09/13 and had been diagnosed with DVT of right leg and could not afford her lovenox.  Patient was last seen by Dr Hart Rochester 08/21/2010.  I instructed patient to call her PCP and schedule an appt today for the management of lovenox/coumadin. [facility] does not have a physician in the office today and I explained that her PCP would be the one to manage her medication.  Patient has an appointment 02/17/13 with Dr Hart Rochester.  Patient verbalized understanding of the instructions and was to call [facility] back if needed.

## 2013-02-10 NOTE — Progress Notes (Signed)
   CARE MANAGEMENT ED NOTE 02/10/2013  Patient:  Shelby Frederick, Shelby Frederick   Account Number:  [id number]  Date Initiated:  02/10/2013  Documentation initiated by:  Fransico Michael  Subjective/Objective Assessment:   seen yesterday in ED at [facility] for c/o leg pain. Diagnosed     Subjective/Objective Assessment Detail:     Action/Plan:   requires medication assistance   Action/Plan Detail:   Anticipated DC Date:  02/09/2013     Status Recommendation to Physician:   Result of Recommendation:      DC Planning Services  [facility]    Choice offered to / List presented to:            Status of service:    ED Comments:   ED Comments Detail:  02/10/13-1445-J.Shahzaib Azevedo,RN,BSN [phone]     Patient noted to be elligible for [facility]. After speaking with  Nicolasa Ducking, CM AD and Brett Canales with [facility], Patient enrolled with MATCH. Phoned patient back and instructed her to come to [facility] pharmacy for medication. Also instructed patient to enroll for patient assistance program for lovenox. And reminded patient again of $3 copay and once a rolling calender year elligibility. No other needs identified.   02/10/13-1434-J.Mycah Formica,RN BSN [phone]      Noted that patient was seen yesterday afternoon at [facility] ED for c/o leg pain. Diagnosed with dvt and discharged home on lovenox. Phoned patient at [phone]. Patient states, "I used to have [place], but they dropped me after my last surgery with Dr. Hart Rochester, I have applied for it again, but it hasn't come thru yet. I can't afford $1500 for medication." Explained MATCH program to patient, including $3 copay and elligibility of once a rolling calendar year. Patient to be assessed for [facility] elligibility.  02/10/13-1427-J.Rylah Fukuda,RN,BSN [phone]     Received call from [employer] regarding patient's request for medication assistance.

## 2013-02-11 ENCOUNTER — Telehealth: Payer: Self-pay | Admitting: *Deleted

## 2013-02-11 ENCOUNTER — Other Ambulatory Visit: Payer: Self-pay | Admitting: *Deleted

## 2013-02-11 DIAGNOSIS — I82401 Acute embolism and thrombosis of unspecified deep veins of right lower extremity: Secondary | ICD-10-CM

## 2013-02-11 NOTE — Telephone Encounter (Signed)
Left a voice mail for this patient (cell phone message box was full so called her home). Told the patient I was following up with her. Since she was seen in the ED and dxed with a DVT, her primary care MD would be the one who would manage her coumadin and there was no need for her to come here 02/17/13 for an ultrasound and to see Dr. Hart Rochester. Dr. Hart Rochester would want to see her in 3 months and repeat the study. I explained that I was therefore cancelling her 6/17 appointment and that Kendal Hymen would be calling her with a new appointment for the study of her right leg and a JDL in 3 months from 02/09/13.

## 2013-02-12 ENCOUNTER — Telehealth: Payer: Self-pay | Admitting: *Deleted

## 2013-02-12 NOTE — Telephone Encounter (Signed)
Clarified to patient the change in her appointment. Discussed treatment of DVT symptoms. Discussed Lovinox and Coumadin. Patient calm and understood. Clarified her future appointment with JDL in Sept. Will follow prn.

## 2013-02-12 NOTE — ED Provider Notes (Signed)
Medical screening examination/treatment/procedure(s) were performed by non-physician practitioner and as supervising physician I was immediately available for consultation/collaboration.  Raeford Razor, MD 02/12/13 [phone]

## 2013-02-16 ENCOUNTER — Ambulatory Visit: Payer: Medicaid - Out of State | Admitting: Vascular Surgery

## 2013-02-17 ENCOUNTER — Ambulatory Visit: Payer: Medicaid - Out of State | Admitting: Vascular Surgery

## 2013-02-20 ENCOUNTER — Emergency Department (HOSPITAL_COMMUNITY)
Admission: EM | Admit: 2013-02-20 | Discharge: 2013-02-20 | Disposition: A | Discharge status: Home / Self Care | Payer: Medicaid - Out of State | Attending: Emergency Medicine | Admitting: Emergency Medicine

## 2013-02-20 ENCOUNTER — Encounter (HOSPITAL_COMMUNITY): Payer: Self-pay | Admitting: Emergency Medicine

## 2013-02-20 DIAGNOSIS — Z9889 Other specified postprocedural states: Secondary | ICD-10-CM | POA: Insufficient documentation

## 2013-02-20 DIAGNOSIS — I809 Phlebitis and thrombophlebitis of unspecified site: Secondary | ICD-10-CM

## 2013-02-20 DIAGNOSIS — I8000 Phlebitis and thrombophlebitis of superficial vessels of unspecified lower extremity: Secondary | ICD-10-CM | POA: Insufficient documentation

## 2013-02-20 DIAGNOSIS — I82401 Acute embolism and thrombosis of unspecified deep veins of right lower extremity: Secondary | ICD-10-CM

## 2013-02-20 DIAGNOSIS — I82409 Acute embolism and thrombosis of unspecified deep veins of unspecified lower extremity: Secondary | ICD-10-CM | POA: Insufficient documentation

## 2013-02-20 DIAGNOSIS — Z7901 Long term (current) use of anticoagulants: Secondary | ICD-10-CM | POA: Insufficient documentation

## 2013-02-20 DIAGNOSIS — F17200 Nicotine dependence, unspecified, uncomplicated: Secondary | ICD-10-CM | POA: Insufficient documentation

## 2013-02-20 DIAGNOSIS — R209 Unspecified disturbances of skin sensation: Secondary | ICD-10-CM | POA: Insufficient documentation

## 2013-02-20 LAB — PROTIME-INR
INR: 0.97 (ref 0.00–1.49)
Prothrombin Time: 12.8 seconds (ref 11.6–15.2)

## 2013-02-20 MED ORDER — NAPROXEN 500 MG PO TABS: 500.0000 mg | ORAL_TABLET | Freq: Two times a day (BID) | ORAL | Status: DC

## 2013-02-20 MED ORDER — ENOXAPARIN SODIUM 150 MG/ML ~~LOC~~ SOLN: 1.0000 mg/kg | Freq: Two times a day (BID) | SUBCUTANEOUS | Status: DC

## 2013-02-20 MED ORDER — HYDROCODONE-ACETAMINOPHEN 5-325 MG PO TABS: 1.0000 | ORAL_TABLET | ORAL | Status: DC | PRN

## 2013-02-20 NOTE — ED Provider Notes (Signed)
History     CSN: [id number]  Arrival date & time 02/20/13  4098   First MD Initiated Contact with Patient 02/20/13 2013      Chief Complaint  Patient presents with  . DVT   HPI  History provided by the patient in recent medical chart. Patient is a 42 year old female with history of previous lower extremity DVTs including a recent diagnosis 11 days ago. Patient had developed pain and soreness in her right lower extremity similar to previous DVTs. She was evaluated in the emergency department found to have a DVT of the right peroneal vein. There was also some question at that time of possible superficial phlebitis however she has had significant varicosity veins do to previous blood clots and changes in her vasculature. Patient states that at that time she initially had some superficial redness and tenderness to the medial calf area just below the mean. Today however she was revisiting the PCP for continued evaluation and she had developed redness in tenderness more proximally to the distal thigh area above the knee. This is a localized area of redness it is tender to palpation. She does continue to have some diffuse swelling of the lower leg and foot. Times she feels tingling in her feet and toes. She denies any other changes or complaints. Denies any fever, chills or sweats. Denies any chest pain, shortness of breath or hemoptysis. Patient has been taking Lovenox twice a day as prescribed every single day since diagnosis.     Past Medical History  Diagnosis Date  . DVT (deep venous thrombosis)     Past Surgical History  Procedure Laterality Date  . Vein bypass surgery      No family history on file.  History  Substance Use Topics  . Smoking status: Current Every Day Smoker -- 0.50 packs/day    Types: Cigarettes  . Smokeless tobacco: Not on file  . Alcohol Use: No    OB History   Grav Para Term Preterm Abortions TAB SAB Ect Mult Living                  Review of Systems    Constitutional: Negative for fever, chills and diaphoresis.  Respiratory: Negative for shortness of breath.   Cardiovascular: Negative for chest pain.  Neurological: Negative for weakness.  All other systems reviewed and are negative.    Allergies  Review of patient's allergies indicates no known allergies.  Home Medications   Current Outpatient Rx  Name  Route  Sig  Dispense  Refill  . enoxaparin (LOVENOX) 100 MG/ML injection   Subcutaneous   Inject 0.85 mLs (85 mg total) into the skin every 12 (twelve) hours.   20 mL   0   . HYDROcodone-acetaminophen (NORCO/VICODIN) 5-325 MG per tablet   Oral   Take 1 tablet by mouth every 8 (eight) hours as needed for pain.           BP 140/90  Pulse 98  Temp(Src) 98.3 F (36.8 C) (Oral)  Resp 16  SpO2 100%  LMP 02/05/2013  Physical Exam  Nursing note and vitals reviewed. Constitutional: She is oriented to person, place, and time. She appears well-developed and well-nourished. No distress.  HENT:  Head: Normocephalic.  Cardiovascular: Normal rate and regular rhythm.   No murmur heard. Pulmonary/Chest: Effort normal and breath sounds normal. No respiratory distress. She has no wheezes. She has no rales.  Abdominal: Soft. There is no tenderness.    There is bruising to the  right lower abdomen consistent with history of recent Lovenox injections. Patient has some linear nodular firmness in this area. The skin is nonerythematous without induration. No signs concerning for abscess infection. There is some suspicion for possible varicose veins in this area.  Musculoskeletal: Normal range of motion. She exhibits edema and tenderness.       Legs: Localized erythema and increased warmth to the right medial distal thigh. There is palpable firmness along superficial veins with tenderness. No erythematous streaks.  Mild diffuse swelling of the lower extremity without erythema or significant increased warmth. Mild to moderate tenderness  along the calf. Normal distal dorsal pedal pulses. Normal sensation in toes and feet. Normal cap refill less than 2 seconds.  Neurological: She is alert and oriented to person, place, and time.  Skin: Skin is warm and dry. No rash noted.  Psychiatric: She has a normal mood and affect. Her behavior is normal.    ED Course  Procedures   Results for orders placed during the hospital encounter of 02/20/13  PROTIME-INR      Result Value Range   Prothrombin Time 12.8  11.6 - 15.2 seconds   INR 0.97  0.00 - 1.49       1. DVT (deep venous thrombosis), right   2. Superficial thrombophlebitis       MDM  8:35PM patient seen and evaluated. Patient well-appearing in no acute distress. Normal respirations and O2 sats. No complaints of chest pain or shortness of breath. No symptoms concerning for possible PE.  Exam consistent with superficial thrombophlebitis. Patient also with recent Doppler ultrasound demonstrating DVT. She is currently taking Lovenox as prescribed.        Angus Seller, PA-C 02/20/13 2228

## 2013-02-20 NOTE — ED Notes (Signed)
Per patient, was recently diagnosed with DVT-right thigh-saw PCP and was told to come to ED because clot has moved

## 2013-02-20 NOTE — ED Notes (Signed)
Pt was seen today by her PCP to follow-up after her DVT dx and get her on a regimen. PCP told pt to come here.

## 2013-02-21 ENCOUNTER — Ambulatory Visit (HOSPITAL_COMMUNITY)
Admission: RE | Admit: 2013-02-21 | Discharge: 2013-02-21 | Disposition: A | Discharge status: Home / Self Care | Payer: Medicaid - Out of State | Source: Ambulatory Visit | Attending: Emergency Medicine | Admitting: Emergency Medicine

## 2013-02-21 DIAGNOSIS — M79609 Pain in unspecified limb: Secondary | ICD-10-CM | POA: Insufficient documentation

## 2013-02-21 DIAGNOSIS — I82B19 Acute embolism and thrombosis of unspecified subclavian vein: Secondary | ICD-10-CM | POA: Insufficient documentation

## 2013-02-21 DIAGNOSIS — I82819 Embolism and thrombosis of superficial veins of unspecified lower extremities: Secondary | ICD-10-CM | POA: Insufficient documentation

## 2013-02-21 NOTE — ED Provider Notes (Signed)
Medical screening examination/treatment/procedure(s) were performed by non-physician practitioner and as supervising physician I was immediately available for consultation/collaboration.  Frandy Basnett, MD 02/21/13 1458 

## 2013-02-21 NOTE — Progress Notes (Signed)
VASCULAR LAB PRELIMINARY  PRELIMINARY  PRELIMINARY  PRELIMINARY  Right lower extremity venous Doppler completed.    Preliminary report:  There is no DVT noted. There is significant SVT noted in the right greater saphenous vein and in varicose veins from the mid calf to the sapheno-femoral junction.  No propagation to the left lower extremity.  Willer Osorno, RVT 02/21/2013, 4:39 PM

## 2013-05-11 ENCOUNTER — Encounter: Payer: Self-pay | Admitting: Vascular Surgery

## 2013-05-12 ENCOUNTER — Encounter: Payer: Self-pay | Admitting: Vascular Surgery

## 2013-05-12 ENCOUNTER — Encounter (INDEPENDENT_AMBULATORY_CARE_PROVIDER_SITE_OTHER): Payer: Medicaid - Out of State | Admitting: *Deleted

## 2013-05-12 ENCOUNTER — Ambulatory Visit (INDEPENDENT_AMBULATORY_CARE_PROVIDER_SITE_OTHER): Payer: Medicaid - Out of State | Admitting: Vascular Surgery

## 2013-05-12 VITALS — BP 144/88 | HR 81 | Resp 16 | Ht 66.0 in | Wt 220.0 lb

## 2013-05-12 DIAGNOSIS — I82401 Acute embolism and thrombosis of unspecified deep veins of right lower extremity: Secondary | ICD-10-CM

## 2013-05-12 DIAGNOSIS — I82409 Acute embolism and thrombosis of unspecified deep veins of unspecified lower extremity: Secondary | ICD-10-CM

## 2013-05-12 DIAGNOSIS — I83893 Varicose veins of bilateral lower extremities with other complications: Secondary | ICD-10-CM | POA: Insufficient documentation

## 2013-05-12 NOTE — Progress Notes (Signed)
Subjective:     Patient ID: Shelby Frederick, female   DOB: 01-29-1971, 42 y.o.   MRN: [id number]  HPI this 42 year old female returns today for followup regarding previous laser ablation in both lower extremities done at different facilities and also a DVT which occurred in June of 2014. She is currently on Coumadin therapy. She had a remote history of DVT in 2001. 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 She develops discomfort in this when she is standing. She has been wearing long leg elastic compression stockings 20-30 mm gradient and trying elevation and ibuprofen for the past 3 months with no improvement. Her edema in the right leg has improved her Coumadin has been well controlled with an INR of 2.3.  Past Medical History  Diagnosis Date  . DVT (deep venous thrombosis)     History  Substance Use Topics  . Smoking status: Current Every Day Smoker -- 0.50 packs/day    Types: Cigarettes  . Smokeless tobacco: Not on file  . Alcohol Use: No    No family history on file.  No Known Allergies  Current outpatient prescriptions:enoxaparin (LOVENOX) 100 MG/ML injection, Inject 0.85 mLs (85 mg total) into the skin every 12 (twelve) hours., Disp: 20 mL, Rfl: 0;  enoxaparin (LOVENOX) 150 MG/ML injection, Inject 0.56 mLs (85 mg total) into the skin every 12 (twelve) hours., Disp: 60 Syringe, Rfl: 0;  HYDROcodone-acetaminophen (NORCO) 5-325 MG per tablet, Take 1 tablet by mouth every 4 (four) hours as needed for pain., Disp: 20 tablet, Rfl: 0 HYDROcodone-acetaminophen (NORCO/VICODIN) 5-325 MG per tablet, Take 1 tablet by mouth every 8 (eight) hours as needed for pain., Disp: , Rfl: ;  naproxen (NAPROSYN) 500 MG tablet, Take 1 tablet (500 mg total) by mouth 2 (two) times daily., Disp: 30 tablet, Rfl: 0  BP 144/88  Pulse 81  Resp 16  Ht 5\' 6"  (1.676 m)  Wt 220 lb (99.791 kg)  BMI 35.53 kg/m2  Body  mass index is 35.53 kg/(m^2).           Review of Systems denies chest pain, dyspnea on exertion, PND, orthopnea, does have a history of pain in legs with walking, pain in feet while lying flat, history of DVT,     Objective:   Physical Exam BP 144/88  Pulse 81  Resp 16  Ht 5\' 6"  (1.676 m)  Wt 220 lb (99.791 kg)  BMI 35.53 kg/m2  General well-developed well-nourished female no apparent stress alert and oriented x3 Lungs no rhonchi or wheezing Right lower extremity with 1+ chronic edema. 3+ dorsalis pedis pulse palpable. Bulging varicosities beginning in the mid anterior thigh extending lateral to the knee and into the lateral pretibial region. Right leg has some similar varicosities but not as prominent in the lateral thigh.  Today I ordered a venous duplex exam of the right leg which are reviewed and interpreted. There is no acute or chronic DVT. There is gross reflux in the anterior accessory branch of the right great saphenous vein which is supplying the bulging varicosities which are symptomatic in the right leg      Assessment:     Venous insufficiency right leg with gross reflux anterior accessory branch right great saphenous vein supplying bulging symptomatic varicosities right thigh and calf History of DVT right leg but no evidence of DVT it present time-on Coumadin    Plan:  Patient needs a laser ablation anterior accessory branch right great saphenous vein with multiple stab phlebectomy is single procedure. Will discontinue Coumadin one week pre-procedure and then resume one week post procedure

## 2018-09-11 NOTE — Patient Instructions (Addendum)
Shelby Frederick  09/11/2018   Your procedure is scheduled on: Tuesday 09/16/2018  Report to [facility] Main  Entrance              Report to admitting at  1130 AM    Call this number if you have problems the morning of surgery [phone]    Remember: Do not eat food  :After Midnight. May have clear liquids from midnight up until 0730 am then nothing until after midnight!    CLEAR LIQUID DIET   Foods Allowed                                                                     Foods Excluded  Coffee and tea, regular and decaf                             liquids that you cannot  Plain Jell-O in any flavor                                             see through such as: Fruit ices (not with fruit pulp)                                     milk, soups, orange juice  Iced Popsicles                                    All solid food Carbonated beverages, regular and diet                                    Cranberry, grape and apple juices Sports drinks like Gatorade Lightly seasoned clear broth or consume(fat free) Sugar, honey syrup  Sample Menu Breakfast                                Lunch                                     Supper Cranberry juice                    Beef broth                            Chicken broth Jell-O                                     Grape juice  Apple juice Coffee or tea                        Jell-O                                      Popsicle                                                Coffee or tea                        Coffee or tea  _____________________________________________________________________               BRUSH YOUR TEETH MORNING OF SURGERY AND RINSE YOUR MOUTH OUT, NO CHEWING GUM CANDY OR MINTS.     Take these medicines the morning of surgery with A SIP OF WATER: Sertraline (Zoloft), Topiramate (Topamax)                                You may not have any metal on your body  including hair pins and              piercings  Do not wear jewelry, make-up, lotions, powders or perfumes, deodorant             Do not wear nail polish.  Do not shave  48 hours prior to surgery.              Do not bring valuables to the hospital. Advance.  Contacts, dentures or bridgework may not be worn into surgery.  Leave suitcase in the car. After surgery it may be brought to your room.                   Please read over the following fact sheets you were given: _____________________________________________________________________             [facility] - Preparing for Surgery Before surgery, you can play an important role.  Because skin is not sterile, your skin needs to be as free of germs as possible.  You can reduce the number of germs on your skin by washing with CHG (chlorahexidine gluconate) soap before surgery.  CHG is an antiseptic cleaner which kills germs and bonds with the skin to continue killing germs even after washing. Please DO NOT use if you have an allergy to CHG or antibacterial soaps.  If your skin becomes reddened/irritated stop using the CHG and inform your nurse when you arrive at Short Stay. Do not shave (including legs and underarms) for at least 48 hours prior to the first CHG shower.  You may shave your face/neck. Please follow these instructions carefully:  1.  Shower with CHG Soap the night before surgery and the  morning of Surgery.  2.  If you choose to wash your hair, wash your hair first as usual with your  normal  shampoo.  3.  After you shampoo, rinse your hair and body thoroughly to remove the  shampoo.  4.  Use CHG as you would any other liquid soap.  You can apply chg directly  to the skin and wash                       Gently with a scrungie or clean washcloth.  5.  Apply the CHG Soap to your body ONLY FROM THE NECK DOWN.   Do not use on face/ open                            Wound or open sores. Avoid contact with eyes, ears mouth and genitals (private parts).                       Wash face,  Genitals (private parts) with your normal soap.             6.  Wash thoroughly, paying special attention to the area where your surgery  will be performed.  7.  Thoroughly rinse your body with warm water from the neck down.  8.  DO NOT shower/wash with your normal soap after using and rinsing off  the CHG Soap.                9.  Pat yourself dry with a clean towel.            10.  Wear clean pajamas.            11.  Place clean sheets on your bed the night of your first shower and do not  sleep with pets. Day of Surgery : Do not apply any lotions/deodorants the morning of surgery.  Please wear clean clothes to the hospital/surgery center.  FAILURE TO FOLLOW THESE INSTRUCTIONS MAY RESULT IN THE CANCELLATION OF YOUR SURGERY PATIENT SIGNATURE_________________________________  NURSE SIGNATURE__________________________________  ________________________________________________________________________   Adam Phenix  An incentive spirometer is a tool that can help keep your lungs clear and active. This tool measures how well you are filling your lungs with each breath. Taking long deep breaths may help reverse or decrease the chance of developing breathing (pulmonary) problems (especially infection) following:  A long period of time when you are unable to move or be active. BEFORE THE PROCEDURE   If the spirometer includes an indicator to show your best effort, your nurse or respiratory therapist will set it to a desired goal.  If possible, sit up straight or lean slightly forward. Try not to slouch.  Hold the incentive spirometer in an upright position. INSTRUCTIONS FOR USE  1. Sit on the edge of your bed if possible, or sit up as far as you can in bed or on a chair. 2. Hold the incentive spirometer in an upright position. 3. Breathe out  normally. 4. Place the mouthpiece in your mouth and seal your lips tightly around it. 5. Breathe in slowly and as deeply as possible, raising the piston or the ball toward the top of the column. 6. Hold your breath for 3-5 seconds or for as long as possible. Allow the piston or ball to fall to the bottom of the column. 7. Remove the mouthpiece from your mouth and breathe out normally. 8. Rest for a few seconds and repeat Steps 1 through 7 at least 10 times every 1-2 hours when you are awake. Take your time and take a few normal breaths between deep breaths. 9. The spirometer may include an indicator to  show your best effort. Use the indicator as a goal to work toward during each repetition. 10. After each set of 10 deep breaths, practice coughing to be sure your lungs are clear. If you have an incision (the cut made at the time of surgery), support your incision when coughing by placing a pillow or rolled up towels firmly against it. Once you are able to get out of bed, walk around indoors and cough well. You may stop using the incentive spirometer when instructed by your caregiver.  RISKS AND COMPLICATIONS  Take your time so you do not get dizzy or light-headed.  If you are in pain, you may need to take or ask for pain medication before doing incentive spirometry. It is harder to take a deep breath if you are having pain. AFTER USE  Rest and breathe slowly and easily.  It can be helpful to keep track of a log of your progress. Your caregiver can provide you with a simple table to help with this. If you are using the spirometer at home, follow these instructions: [place] IF:   You are having difficultly using the spirometer.  You have trouble using the spirometer as often as instructed.  Your pain medication is not giving enough relief while using the spirometer.  You develop fever of 100.5 F (38.1 C) or higher. SEEK IMMEDIATE MEDICAL CARE IF:   You cough up bloody sputum  that had not been present before.  You develop fever of 102 F (38.9 C) or greater.  You develop worsening pain at or near the incision site. MAKE SURE YOU:   Understand these instructions.  Will watch your condition.  Will get help right away if you are not doing well or get worse. Document Released: 12/31/2006 Document Revised: 11/12/2011 Document Reviewed: 03/03/2007 [facility] Patient Information 2014 [place], [place].   ________________________________________________________________________

## 2018-09-15 ENCOUNTER — Encounter (HOSPITAL_COMMUNITY): Payer: Self-pay | Admitting: *Deleted

## 2018-09-15 ENCOUNTER — Other Ambulatory Visit: Payer: Self-pay

## 2018-09-15 ENCOUNTER — Encounter (HOSPITAL_COMMUNITY)
Admission: RE | Admit: 2018-09-15 | Discharge: 2018-09-15 | Disposition: A | Discharge status: Home / Self Care | Payer: Medicare Other | Source: Ambulatory Visit | Attending: Orthopedic Surgery | Admitting: Orthopedic Surgery

## 2018-09-15 DIAGNOSIS — S42232A 3-part fracture of surgical neck of left humerus, initial encounter for closed fracture: Secondary | ICD-10-CM | POA: Diagnosis not present

## 2018-09-15 DIAGNOSIS — Z87891 Personal history of nicotine dependence: Secondary | ICD-10-CM | POA: Diagnosis not present

## 2018-09-15 DIAGNOSIS — G43909 Migraine, unspecified, not intractable, without status migrainosus: Secondary | ICD-10-CM | POA: Diagnosis not present

## 2018-09-15 DIAGNOSIS — S42202A Unspecified fracture of upper end of left humerus, initial encounter for closed fracture: Secondary | ICD-10-CM

## 2018-09-15 DIAGNOSIS — Z86718 Personal history of other venous thrombosis and embolism: Secondary | ICD-10-CM | POA: Diagnosis not present

## 2018-09-15 DIAGNOSIS — M6281 Muscle weakness (generalized): Secondary | ICD-10-CM | POA: Diagnosis not present

## 2018-09-15 DIAGNOSIS — Z01812 Encounter for preprocedural laboratory examination: Secondary | ICD-10-CM | POA: Insufficient documentation

## 2018-09-15 DIAGNOSIS — Z79899 Other long term (current) drug therapy: Secondary | ICD-10-CM | POA: Diagnosis not present

## 2018-09-15 DIAGNOSIS — W1830XA Fall on same level, unspecified, initial encounter: Secondary | ICD-10-CM | POA: Diagnosis not present

## 2018-09-15 DIAGNOSIS — Z7901 Long term (current) use of anticoagulants: Secondary | ICD-10-CM | POA: Diagnosis not present

## 2018-09-15 HISTORY — DX: Headache, unspecified: R51.9

## 2018-09-15 HISTORY — DX: Headache: R51

## 2018-09-15 LAB — BASIC METABOLIC PANEL
Anion gap: 9 (ref 5–15)
BUN: 21 mg/dL — ABNORMAL HIGH (ref 6–20)
CO2: 23 mmol/L (ref 22–32)
Calcium: 9.2 mg/dL (ref 8.9–10.3)
Chloride: 107 mmol/L (ref 98–111)
Creatinine, Ser: 0.77 mg/dL (ref 0.44–1.00)
GFR calc Af Amer: >60 mL/min (ref >60)
GFR calc non Af Amer: >60 mL/min (ref >60)
Glucose, Bld: 100 mg/dL — ABNORMAL HIGH (ref 70–99)
Potassium: 3.7 mmol/L (ref 3.5–5.1)
Sodium: 139 mmol/L (ref 135–145)

## 2018-09-15 LAB — CBC
HCT: 40 % (ref 36.0–46.0)
Hemoglobin: 12.6 g/dL (ref 12.0–15.0)
MCH: 28.7 pg (ref 26.0–34.0)
MCHC: 31.5 g/dL (ref 30.0–36.0)
MCV: 91.1 fL (ref 80.0–100.0)
Platelets: 252 10*3/uL (ref 150–400)
RBC: 4.39 MIL/uL (ref 3.87–5.11)
RDW: 14.3 % (ref 11.5–15.5)
WBC: 10.3 10*3/uL (ref 4.0–10.5)
nRBC: 0 % (ref 0.0–0.2)

## 2018-09-16 ENCOUNTER — Encounter (HOSPITAL_COMMUNITY)
Admission: RE | Disposition: A | Discharge status: Home / Self Care | Payer: Self-pay | Source: Home / Self Care | Attending: Orthopedic Surgery

## 2018-09-16 ENCOUNTER — Ambulatory Visit (HOSPITAL_COMMUNITY): Payer: Medicare Other

## 2018-09-16 ENCOUNTER — Observation Stay (HOSPITAL_COMMUNITY)
Admission: RE | Admit: 2018-09-16 | Discharge: 2018-09-17 | Disposition: A | Discharge status: Home / Self Care | Payer: Medicare Other | Attending: Orthopedic Surgery | Admitting: Orthopedic Surgery

## 2018-09-16 ENCOUNTER — Other Ambulatory Visit: Payer: Self-pay

## 2018-09-16 ENCOUNTER — Encounter (HOSPITAL_COMMUNITY): Payer: Self-pay | Admitting: Anesthesiology

## 2018-09-16 ENCOUNTER — Ambulatory Visit (HOSPITAL_COMMUNITY): Payer: Medicare Other | Admitting: Physician Assistant

## 2018-09-16 ENCOUNTER — Ambulatory Visit (HOSPITAL_COMMUNITY): Payer: Medicare Other | Admitting: Certified Registered Nurse Anesthetist

## 2018-09-16 DIAGNOSIS — M6281 Muscle weakness (generalized): Secondary | ICD-10-CM | POA: Insufficient documentation

## 2018-09-16 DIAGNOSIS — G43909 Migraine, unspecified, not intractable, without status migrainosus: Secondary | ICD-10-CM | POA: Insufficient documentation

## 2018-09-16 DIAGNOSIS — Z87891 Personal history of nicotine dependence: Secondary | ICD-10-CM | POA: Insufficient documentation

## 2018-09-16 DIAGNOSIS — W1830XA Fall on same level, unspecified, initial encounter: Secondary | ICD-10-CM | POA: Insufficient documentation

## 2018-09-16 DIAGNOSIS — S42209A Unspecified fracture of upper end of unspecified humerus, initial encounter for closed fracture: Secondary | ICD-10-CM | POA: Diagnosis present

## 2018-09-16 DIAGNOSIS — Z79899 Other long term (current) drug therapy: Secondary | ICD-10-CM | POA: Insufficient documentation

## 2018-09-16 DIAGNOSIS — Z419 Encounter for procedure for purposes other than remedying health state, unspecified: Secondary | ICD-10-CM

## 2018-09-16 DIAGNOSIS — Z86718 Personal history of other venous thrombosis and embolism: Secondary | ICD-10-CM | POA: Insufficient documentation

## 2018-09-16 DIAGNOSIS — S42232A 3-part fracture of surgical neck of left humerus, initial encounter for closed fracture: Principal | ICD-10-CM | POA: Insufficient documentation

## 2018-09-16 DIAGNOSIS — Z7901 Long term (current) use of anticoagulants: Secondary | ICD-10-CM | POA: Insufficient documentation

## 2018-09-16 HISTORY — PX: ORIF HUMERUS FRACTURE: SHX2126

## 2018-09-16 LAB — POCT I-STAT 4, (NA,K, GLUC, HGB,HCT)
Glucose, Bld: 85 mg/dL (ref 70–99)
HCT: 33 % — ABNORMAL LOW (ref 36.0–46.0)
Hemoglobin: 11.2 g/dL — ABNORMAL LOW (ref 12.0–15.0)
Potassium: 4 mmol/L (ref 3.5–5.1)
Sodium: 140 mmol/L (ref 135–145)

## 2018-09-16 SURGERY — OPEN REDUCTION INTERNAL FIXATION (ORIF) PROXIMAL HUMERUS FRACTURE
Anesthesia: General | Site: Arm Upper | Laterality: Left

## 2018-09-16 MED ORDER — PROMETHAZINE HCL 25 MG/ML IJ SOLN: 6.2500 mg | INTRAMUSCULAR | Status: DC | PRN

## 2018-09-16 MED ORDER — SERTRALINE HCL 100 MG PO TABS: 100.0000 mg | ORAL_TABLET | Freq: Every day | ORAL | Status: DC

## 2018-09-16 MED ORDER — LACTATED RINGERS IV SOLN: INTRAVENOUS | Status: DC

## 2018-09-16 MED ORDER — CHLORHEXIDINE GLUCONATE 4 % EX LIQD: 60.0000 mL | Freq: Once | CUTANEOUS | Status: DC

## 2018-09-16 MED ORDER — PHENOL 1.4 % MT LIQD
1.0000 | OROMUCOSAL | Status: DC | PRN
  Filled 2018-09-16: qty 177

## 2018-09-16 MED ORDER — PROPOFOL 10 MG/ML IV BOLUS
INTRAVENOUS | Status: AC
  Filled 2018-09-16: qty 20

## 2018-09-16 MED ORDER — DEXAMETHASONE SODIUM PHOSPHATE 10 MG/ML IJ SOLN
INTRAMUSCULAR | Status: AC
  Filled 2018-09-16: qty 1

## 2018-09-16 MED ORDER — LACTATED RINGERS IV SOLN
INTRAVENOUS | Status: DC
  Administered 2018-09-16: 1000 mL via INTRAVENOUS
  Administered 2018-09-16: 16:00:00 via INTRAVENOUS

## 2018-09-16 MED ORDER — OXYCODONE HCL 5 MG PO TABS
5.0000 mg | ORAL_TABLET | ORAL | Status: DC | PRN
  Administered 2018-09-16: 5 mg via ORAL
  Filled 2018-09-16: qty 1

## 2018-09-16 MED ORDER — CEFAZOLIN SODIUM-DEXTROSE 2-4 GM/100ML-% IV SOLN: 2.0000 g | INTRAVENOUS | Status: DC

## 2018-09-16 MED ORDER — MENTHOL 3 MG MT LOZG: 1.0000 | LOZENGE | OROMUCOSAL | Status: DC | PRN

## 2018-09-16 MED ORDER — OXYCODONE HCL 5 MG PO TABS: 10.0000 mg | ORAL_TABLET | ORAL | Status: DC | PRN

## 2018-09-16 MED ORDER — SODIUM CHLORIDE 0.9 % IV SOLN
INTRAVENOUS | Status: DC | PRN
  Administered 2018-09-16: 25 ug/min via INTRAVENOUS

## 2018-09-16 MED ORDER — ALBUMIN HUMAN 5 % IV SOLN
INTRAVENOUS | Status: DC | PRN
  Administered 2018-09-16: 16:00:00 via INTRAVENOUS

## 2018-09-16 MED ORDER — HYDROMORPHONE HCL 1 MG/ML IJ SOLN
INTRAMUSCULAR | Status: AC
  Filled 2018-09-16: qty 1

## 2018-09-16 MED ORDER — DIPHENHYDRAMINE HCL 12.5 MG/5ML PO ELIX: 12.5000 mg | ORAL_SOLUTION | ORAL | Status: DC | PRN

## 2018-09-16 MED ORDER — HYDROMORPHONE HCL 1 MG/ML IJ SOLN: 0.5000 mg | INTRAMUSCULAR | Status: DC | PRN

## 2018-09-16 MED ORDER — FENTANYL CITRATE (PF) 100 MCG/2ML IJ SOLN
50.0000 ug | Freq: Once | INTRAMUSCULAR | Status: AC
  Administered 2018-09-16: 50 ug via INTRAVENOUS
  Filled 2018-09-16: qty 2

## 2018-09-16 MED ORDER — ONDANSETRON HCL 4 MG/2ML IJ SOLN
INTRAMUSCULAR | Status: AC
  Filled 2018-09-16: qty 2

## 2018-09-16 MED ORDER — BUPIVACAINE HCL (PF) 0.5 % IJ SOLN
INTRAMUSCULAR | Status: DC | PRN
  Administered 2018-09-16: 30 mL via PERINEURAL

## 2018-09-16 MED ORDER — EZETIMIBE 10 MG PO TABS: 10.0000 mg | ORAL_TABLET | Freq: Every day | ORAL | Status: DC

## 2018-09-16 MED ORDER — BUPIVACAINE LIPOSOME 1.3 % IJ SUSP
INTRAMUSCULAR | Status: DC | PRN
  Administered 2018-09-16: 10 mL via PERINEURAL

## 2018-09-16 MED ORDER — MIDAZOLAM HCL 5 MG/5ML IJ SOLN
INTRAMUSCULAR | Status: DC | PRN
  Administered 2018-09-16: 2 mg via INTRAVENOUS

## 2018-09-16 MED ORDER — METOCLOPRAMIDE HCL 5 MG/ML IJ SOLN: 5.0000 mg | Freq: Three times a day (TID) | INTRAMUSCULAR | Status: DC | PRN

## 2018-09-16 MED ORDER — SUGAMMADEX SODIUM 200 MG/2ML IV SOLN
INTRAVENOUS | Status: DC | PRN
  Administered 2018-09-16: 200 mg via INTRAVENOUS

## 2018-09-16 MED ORDER — ROCURONIUM BROMIDE 10 MG/ML (PF) SYRINGE
PREFILLED_SYRINGE | INTRAVENOUS | Status: DC | PRN
  Administered 2018-09-16: 50 mg via INTRAVENOUS
  Administered 2018-09-16: 20 mg via INTRAVENOUS

## 2018-09-16 MED ORDER — METOCLOPRAMIDE HCL 5 MG PO TABS: 5.0000 mg | ORAL_TABLET | Freq: Three times a day (TID) | ORAL | Status: DC | PRN

## 2018-09-16 MED ORDER — 0.9 % SODIUM CHLORIDE (POUR BTL) OPTIME
TOPICAL | Status: DC | PRN
  Administered 2018-09-16: 1000 mL

## 2018-09-16 MED ORDER — ACETAMINOPHEN 325 MG PO TABS
325.0000 mg | ORAL_TABLET | Freq: Four times a day (QID) | ORAL | Status: DC | PRN
  Administered 2018-09-16: 650 mg via ORAL
  Filled 2018-09-16: qty 2

## 2018-09-16 MED ORDER — HYDROMORPHONE HCL 1 MG/ML IJ SOLN
0.2500 mg | INTRAMUSCULAR | Status: DC | PRN
  Administered 2018-09-16 (×3): 0.5 mg via INTRAVENOUS

## 2018-09-16 MED ORDER — MEPERIDINE HCL 50 MG/ML IJ SOLN: 6.2500 mg | INTRAMUSCULAR | Status: DC | PRN

## 2018-09-16 MED ORDER — PANTOPRAZOLE SODIUM 40 MG PO TBEC: 40.0000 mg | DELAYED_RELEASE_TABLET | Freq: Every day | ORAL | Status: DC

## 2018-09-16 MED ORDER — OXYBUTYNIN CHLORIDE 5 MG PO TABS
5.0000 mg | ORAL_TABLET | Freq: Two times a day (BID) | ORAL | Status: DC
  Administered 2018-09-16: 5 mg via ORAL
  Filled 2018-09-16: qty 1

## 2018-09-16 MED ORDER — BISACODYL 5 MG PO TBEC: 5.0000 mg | DELAYED_RELEASE_TABLET | Freq: Every day | ORAL | Status: DC | PRN

## 2018-09-16 MED ORDER — ONDANSETRON HCL 4 MG/2ML IJ SOLN
INTRAMUSCULAR | Status: DC | PRN
  Administered 2018-09-16: 4 mg via INTRAVENOUS

## 2018-09-16 MED ORDER — MIDAZOLAM HCL 2 MG/2ML IJ SOLN
INTRAMUSCULAR | Status: AC
  Filled 2018-09-16: qty 2

## 2018-09-16 MED ORDER — TOPIRAMATE 100 MG PO TABS
100.0000 mg | ORAL_TABLET | Freq: Two times a day (BID) | ORAL | Status: DC
  Administered 2018-09-16: 100 mg via ORAL
  Filled 2018-09-16: qty 1

## 2018-09-16 MED ORDER — MAGNESIUM CITRATE PO SOLN: 1.0000 | Freq: Once | ORAL | Status: DC | PRN

## 2018-09-16 MED ORDER — CEFAZOLIN SODIUM-DEXTROSE 2-4 GM/100ML-% IV SOLN
2.0000 g | INTRAVENOUS | Status: AC
  Administered 2018-09-16: 2 g via INTRAVENOUS

## 2018-09-16 MED ORDER — ROCURONIUM BROMIDE 10 MG/ML (PF) SYRINGE
PREFILLED_SYRINGE | INTRAVENOUS | Status: AC
  Filled 2018-09-16: qty 10

## 2018-09-16 MED ORDER — LIDOCAINE 2% (20 MG/ML) 5 ML SYRINGE
INTRAMUSCULAR | Status: AC
  Filled 2018-09-16: qty 5

## 2018-09-16 MED ORDER — MIDAZOLAM HCL 2 MG/2ML IJ SOLN
1.0000 mg | Freq: Once | INTRAMUSCULAR | Status: AC
  Administered 2018-09-16: 2 mg via INTRAVENOUS
  Filled 2018-09-16: qty 2

## 2018-09-16 MED ORDER — FENTANYL CITRATE (PF) 250 MCG/5ML IJ SOLN
INTRAMUSCULAR | Status: DC | PRN
  Administered 2018-09-16: 50 ug via INTRAVENOUS
  Administered 2018-09-16: 100 ug via INTRAVENOUS

## 2018-09-16 MED ORDER — METHOCARBAMOL 500 MG PO TABS: 500.0000 mg | ORAL_TABLET | Freq: Four times a day (QID) | ORAL | Status: DC | PRN

## 2018-09-16 MED ORDER — PROPOFOL 10 MG/ML IV BOLUS
INTRAVENOUS | Status: DC | PRN
  Administered 2018-09-16: 150 mg via INTRAVENOUS

## 2018-09-16 MED ORDER — TEMAZEPAM 15 MG PO CAPS
15.0000 mg | ORAL_CAPSULE | Freq: Every evening | ORAL | Status: DC | PRN
  Administered 2018-09-17: 15 mg via ORAL
  Filled 2018-09-16: qty 1

## 2018-09-16 MED ORDER — FENTANYL CITRATE (PF) 250 MCG/5ML IJ SOLN
INTRAMUSCULAR | Status: AC
  Filled 2018-09-16: qty 5

## 2018-09-16 MED ORDER — LIDOCAINE 2% (20 MG/ML) 5 ML SYRINGE
INTRAMUSCULAR | Status: DC | PRN
  Administered 2018-09-16: 60 mg via INTRAVENOUS

## 2018-09-16 MED ORDER — DEXAMETHASONE SODIUM PHOSPHATE 10 MG/ML IJ SOLN
INTRAMUSCULAR | Status: DC | PRN
  Administered 2018-09-16: 10 mg via INTRAVENOUS

## 2018-09-16 MED ORDER — ONDANSETRON HCL 4 MG/2ML IJ SOLN: 4.0000 mg | Freq: Four times a day (QID) | INTRAMUSCULAR | Status: DC | PRN

## 2018-09-16 MED ORDER — POLYETHYLENE GLYCOL 3350 17 G PO PACK: 17.0000 g | PACK | Freq: Every day | ORAL | Status: DC | PRN

## 2018-09-16 MED ORDER — ONDANSETRON HCL 4 MG PO TABS: 4.0000 mg | ORAL_TABLET | Freq: Four times a day (QID) | ORAL | Status: DC | PRN

## 2018-09-16 MED ORDER — CEFAZOLIN SODIUM-DEXTROSE 2-4 GM/100ML-% IV SOLN
INTRAVENOUS | Status: AC
  Filled 2018-09-16: qty 100

## 2018-09-16 MED ORDER — TRANEXAMIC ACID-NACL 1000-0.7 MG/100ML-% IV SOLN
INTRAVENOUS | Status: DC | PRN
  Administered 2018-09-16: 1000 mg via INTRAVENOUS

## 2018-09-16 MED ORDER — METHOCARBAMOL 500 MG IVPB - SIMPLE MED
500.0000 mg | Freq: Four times a day (QID) | INTRAVENOUS | Status: DC | PRN
  Administered 2018-09-16: 500 mg via INTRAVENOUS
  Filled 2018-09-16: qty 50

## 2018-09-16 MED ORDER — METHOCARBAMOL 500 MG IVPB - SIMPLE MED
INTRAVENOUS | Status: AC
  Filled 2018-09-16: qty 50

## 2018-09-16 MED ORDER — TRANEXAMIC ACID-NACL 1000-0.7 MG/100ML-% IV SOLN
INTRAVENOUS | Status: AC
  Filled 2018-09-16: qty 100

## 2018-09-16 MED ORDER — HYDROMORPHONE HCL 1 MG/ML IJ SOLN: 0.2500 mg | INTRAMUSCULAR | Status: DC | PRN

## 2018-09-16 MED ORDER — DOCUSATE SODIUM 100 MG PO CAPS
100.0000 mg | ORAL_CAPSULE | Freq: Two times a day (BID) | ORAL | Status: DC
  Administered 2018-09-16: 100 mg via ORAL
  Filled 2018-09-16: qty 1

## 2018-09-16 MED ORDER — LACTATED RINGERS IV SOLN
INTRAVENOUS | Status: DC
  Administered 2018-09-16 (×2): 75 mL/h via INTRAVENOUS

## 2018-09-16 SURGICAL SUPPLY — 57 items
BAG ZIPLOCK 12X15 (MISCELLANEOUS) ×1 IMPLANT
BIT DRILL 3.2 (BIT) ×2
BIT DRILL 3.2XCALB NS DISP (BIT) IMPLANT
BIT DRILL CALIBRATED 2.7 (BIT) ×1 IMPLANT
BIT DRILL CALIBRATED 2.7MM (BIT) ×1
BIT DRL 3.2XCALB NS DISP (BIT) ×1
CLOSURE WOUND 1/2 X4 (GAUZE/BANDAGES/DRESSINGS)
COVER SURGICAL LIGHT HANDLE (MISCELLANEOUS) ×3 IMPLANT
COVER WAND RF STERILE (DRAPES) ×2 IMPLANT
DERMABOND ADVANCED (GAUZE/BANDAGES/DRESSINGS) ×2
DERMABOND ADVANCED .7 DNX12 (GAUZE/BANDAGES/DRESSINGS) IMPLANT
DRAPE ORTHO SPLIT 77X108 STRL (DRAPES) ×4
DRAPE POUCH INSTRU U-SHP 10X18 (DRAPES) ×3 IMPLANT
DRAPE SURG 17X11 SM STRL (DRAPES) ×3 IMPLANT
DRAPE SURG ORHT 6 SPLT 77X108 (DRAPES) ×2 IMPLANT
DRAPE U-SHAPE 47X51 STRL (DRAPES) ×3 IMPLANT
DRSG AQUACEL AG ADV 3.5X 6 (GAUZE/BANDAGES/DRESSINGS) ×1 IMPLANT
DRSG AQUACEL AG ADV 3.5X10 (GAUZE/BANDAGES/DRESSINGS) ×2 IMPLANT
DURAPREP 26ML APPLICATOR (WOUND CARE) ×3 IMPLANT
ELECT REM PT RETURN 15FT ADLT (MISCELLANEOUS) ×3 IMPLANT
GLOVE BIO SURGEON STRL SZ7.5 (GLOVE) ×3 IMPLANT
GLOVE BIO SURGEON STRL SZ8 (GLOVE) ×3 IMPLANT
GLOVE ECLIPSE 7.5 STRL STRAW (GLOVE) ×3 IMPLANT
GLOVE SS BIOGEL STRL SZ 7 (GLOVE) ×1 IMPLANT
GLOVE SUPERSENSE BIOGEL SZ 7 (GLOVE) ×2
GOWN STRL REUS W/TWL LRG LVL3 (GOWN DISPOSABLE) ×10 IMPLANT
K-WIRE 2X5 SS THRDED S3 (WIRE) ×3
KIT BASIN OR (CUSTOM PROCEDURE TRAY) ×3 IMPLANT
KWIRE 2X5 SS THRDED S3 (WIRE) IMPLANT
MANIFOLD NEPTUNE II (INSTRUMENTS) ×3 IMPLANT
NDL MAYO CATGUT SZ4 TPR NDL (NEEDLE) IMPLANT
NEEDLE MAYO CATGUT SZ4 (NEEDLE) ×3 IMPLANT
NS IRRIG 1000ML POUR BTL (IV SOLUTION) ×3 IMPLANT
PACK SHOULDER (CUSTOM PROCEDURE TRAY) ×3 IMPLANT
PEG LOCKING 3.2MMX46 (Peg) ×2 IMPLANT
PEG LOCKING 3.2X36 (Screw) ×6 IMPLANT
PEG LOCKING 3.2X38 (Screw) ×2 IMPLANT
PEG LOCKING 3.2X40 (Peg) ×4 IMPLANT
PEG LOCKING 3.2X42 (Screw) ×2 IMPLANT
PEG LOCKING 3.2X50 (Screw) ×2 IMPLANT
PEG LOCKING 3.2X52 (Peg) ×2 IMPLANT
PLATE PROX HUM HI L 3H 80 (Plate) ×2 IMPLANT
PROTECTOR NERVE ULNAR (MISCELLANEOUS) ×1 IMPLANT
PUTTY DBM STAGRAFT PLUS 10CC (Putty) ×2 IMPLANT
SCREW LOCK CORT STAR 3.5X22 (Screw) ×2 IMPLANT
SCREW LP NL T15 3.5X22 (Screw) ×6 IMPLANT
SLING ARM FOAM STRAP LRG (SOFTGOODS) ×2 IMPLANT
SLING ARM IMMOBILIZER LRG (SOFTGOODS) ×1 IMPLANT
SPONGE LAP 4X18 RFD (DISPOSABLE) ×2 IMPLANT
STRIP CLOSURE SKIN 1/2X4 (GAUZE/BANDAGES/DRESSINGS) ×1 IMPLANT
SUT FIBERWIRE #2 38 T-5 BLUE (SUTURE) ×6
SUT MNCRL AB 3-0 PS2 18 (SUTURE) ×3 IMPLANT
SUT MON AB 2-0 CT1 36 (SUTURE) ×4 IMPLANT
SUT VIC AB 1 CT1 36 (SUTURE) ×3 IMPLANT
SUT VIC AB 2-0 CT1 27 (SUTURE) ×2
SUT VIC AB 2-0 CT1 TAPERPNT 27 (SUTURE) ×1 IMPLANT
SUTURE FIBERWR #2 38 T-5 BLUE (SUTURE) IMPLANT

## 2018-09-16 NOTE — Op Note (Signed)
09/16/2018  4:57 PM  PATIENT:   Shelby Frederick  48 y.o. female  PRE-OPERATIVE DIAGNOSIS:  displaced 3 part left proximal humerus fracture  POST-OPERATIVE DIAGNOSIS: Same  PROCEDURE: ORIF displaced left three-part proximal humerus fracture  SURGEON:  Tanaysia Bhardwaj, Metta Clines M.D.  ASSISTANTS: Jenetta Loges, PA-C  ANESTHESIA:   General endotracheal as well as interscalene block with Exparel  EBL: 300 cc  SPECIMEN: None  Drains: None   PATIENT DISPOSITION:  PACU - hemodynamically stable.    PLAN OF CARE: Admit for overnight observation  Brief history:  Patient is a 48 year old female who had a ground-level fall sustaining a displaced left three-part proximal humerus fracture.  Due to the degree of displacement she is brought to the operating this time for planned open reduction and internal fixation  Preoperatively she had been counseled regarding treatment options and the potential risks versus benefits thereof.  Possible surgical complications were reviewed including bleeding, infection, neurovascular injury, persistent pain, loss of motion, malunion, nonunion, loss of fixation, and possible need for additional surgery.  She understands and accepts and agrees with the planned procedure.  Procedure in detail:  After undergoing routine preop evaluation patient received prophylactic antibiotics and interscalene block with Exparel was established in the holding area by the anesthesia department.  Placed supine on the operative underwent smooth induction of a general endotracheal anesthesia.  Placed into the beachchair position and appropriate padded and protected.  At this point fluoroscopic imaging was then brought in to confirm appropriate visualization of the shoulder.  Left shoulder girdle region was sterilely prepped and draped in standard fashion.  Timeout was called.  An anterior deltopectoral approach left shoulder was made through a 10 cm incision.  Skin flaps elevated dissection  carried deeply to the deltopectoral interval which was developed from proximal to distal with a vein taken laterally.  Conjoined tendon mobilized retracted medially and divided adhesions beneath the deltoid gaining access to the proximal humerus at the level of the fracture site and the tuberosities.  At this point I manipulated the fracture site and then passed a pair of tag sutures with #2 FiberWire through the bone tendon junction of the greater tuberosity fragment to help with mobilization.  Reduction was then affected and the humeral head was elevated the greater tuberosity was then moved in position and was provisionally held a Biomet proximal humeral plate over the anterolateral aspect of the humeral shaft maintaining proper position and alignment and fluoroscopic imaging was then used to confirm proper position of the fracture site good position the hardware.  Once an appropriate reduction had been achieved we placed a guidepin up into the central of the humeral head and with this was properly positioned a single screw was placed in the humeral shaft.  We then proceeded to fill the proximal locking pegs using fluoroscopic imaging to confirm proper pin length.  And then final fixation on the shaft was achieved with 3 screws.  Our final construct showed good overall position was much to our satisfaction and used fluoroscopic imaging to confirm that all hardware was in proper position.  This point we then utilized approximately 6 cc of DBM putty to help support and fill the defects within the humeral head and this was impacted sequentially such that we obtained excellent fill of the defect at the fracture site.  Final images showed good position alignment.  Wound was then copiously irrigated.  The deltopectoral interval was then reapproximated with a series of figure-of-eight number Vicryl sutures.  2-0  Vicryl used for the subcu layer intracuticular 3 Monocryl for the skin followed by Dermabond and Aquasol  dressing left arm placed into a sling and the patient is awakened extubated taken care of him in stable condition  Jenetta Loges, PA-C was used as an Environmental consultant throughout this case essential for help with positioning the patient, position extremity, manipulation the fracture site, wound closure, and intraoperative decision making.  Metta Clines Holland Kotter MD   Contact # [phone]

## 2018-09-16 NOTE — Progress Notes (Signed)
Assisted Dr. Hollis with left, ultrasound guided, supraclavicular block. Side rails up, monitors on throughout procedure. See vital signs in flow sheet. Tolerated Procedure well. 

## 2018-09-16 NOTE — Discharge Instructions (Signed)
Metta Clines. Supple, M.D., F.A.A.O.S. Orthopaedic Surgery Specializing in Arthroscopic and Reconstructive Surgery of the Shoulder and Knee [phone] [address] [place], [place] [address] - Fax [phone]   POST-OP TOTAL SHOULDER REPLACEMENT INSTRUCTIONS  1. Call the office at [phone] to schedule your first post-op appointment 10-14 days from the date of your surgery.  2. The bandage over your incision is waterproof. You may begin showering with this dressing on. You may leave this dressing on until first follow up appointment within 2 weeks. We prefer you leave this dressing in place until follow up however after 5-7 days if you are having itching or skin irritation and would like to remove it you may do so. Go slow and tug at the borders gently to break the bond the dressing has with the skin. At this point if there is no drainage it is okay to go without a bandage or you may cover it with a light guaze and tape. You can also expect significant bruising around your shoulder that will drift down your arm and into your chest wall. This is very normal and should resolve over several days.   3. Wear your sling/immobilizer at all times except to perform the exercises below or to occasionally let your arm dangle by your side to stretch your elbow. You also need to sleep in your sling immobilizer until instructed otherwise.  4. Range of motion to your elbow, wrist, and hand are encouraged 3-5 times daily. Exercise to your hand and fingers helps to reduce swelling you may experience.  5. Utilize ice to the shoulder 3-5 times minimum a day and additionally if you are experiencing pain.  6. Prescriptions for a pain medication and a muscle relaxant are provided for you. It is recommended that if you are experiencing pain that you pain medication alone is not controlling, add the muscle relaxant along with the pain medication which can give additional pain relief. The first 1-2 days  is generally the most severe of your pain and then should gradually decrease. As your pain lessens it is recommended that you decrease your use of the pain medications to an "as needed basis'" only and to always comply with the recommended dosages of the pain medications.  7. Pain medications can produce constipation along with their use. If you experience this, the use of an over the counter stool softener or laxative daily is recommended.   8. For additional questions or concerns, please do not hesitate to call the office. If after hours there is an answering service to forward your concerns to the physician on call.  9.Pain control following an exparel block  To help control your post-operative pain you received a nerve block  performed with Exparel which is a long acting anesthetic (numbing agent) which can provide pain relief and sensations of numbness (and relief of pain) in the operative shoulder and arm for up to 3 days. Sometimes it provides mixed relief, meaning you may still have numbness in certain areas of the arm but can still be able to move  parts of that arm, hand, and fingers. We recommend that your prescribed pain medications  be used as needed. We do not feel it is necessary to "pre medicate" and "stay ahead" of pain.  Taking narcotic pain medications when you are not having any pain can lead to unnecessary and potentially dangerous side effects.   POST-OP EXERCISES  Ok to allow arm to dangle and perform elbow wrist and  hand range of motion

## 2018-09-16 NOTE — Anesthesia Procedure Notes (Signed)
Procedure Name: Intubation Date/Time: 09/16/2018 3:10 PM Performed by: Mitzie Na, CRNA Pre-anesthesia Checklist: Patient identified, Emergency Drugs available, Suction available, Patient being monitored and Timeout performed Patient Re-evaluated:Patient Re-evaluated prior to induction Oxygen Delivery Method: Circle system utilized Preoxygenation: Pre-oxygenation with 100% oxygen Induction Type: IV induction Ventilation: Mask ventilation without difficulty and Oral airway inserted - appropriate to patient size Laryngoscope Size: Mac and 3 Grade View: Grade I Tube type: Oral Tube size: 7.0 mm Number of attempts: 1 Airway Equipment and Method: Stylet Placement Confirmation: ETT inserted through vocal cords under direct vision,  positive ETCO2 and breath sounds checked- equal and bilateral Secured at: 24 cm Tube secured with: Tape Dental Injury: Teeth and Oropharynx as per pre-operative assessment

## 2018-09-16 NOTE — Transfer of Care (Signed)
Immediate Anesthesia Transfer of Care Note  Patient: Shelby Frederick  Procedure(s) Performed: OPEN REDUCTION INTERNAL FIXATION (ORIF) LEFT PROXIMAL HUMERUS FRACTURE (Left Arm Upper)  Patient Location: PACU  Anesthesia Type:General  Level of Consciousness: awake and alert   Airway & Oxygen Therapy: Patient Spontanous Breathing and Patient connected to face mask oxygen  Post-op Assessment: Report given to RN and Post -op Vital signs reviewed and stable  Post vital signs: Reviewed and stable  Last Vitals:  Vitals Value Taken Time  BP 127/71 09/16/2018  5:19 PM  Temp    Pulse 103 09/16/2018  5:21 PM  Resp 18 09/16/2018  5:21 PM  SpO2 100 % 09/16/2018  5:21 PM  Vitals shown include unvalidated device data.  Last Pain:  Vitals:   09/16/18 1232  TempSrc:   PainSc: 10-Worst pain ever      Patients Stated Pain Goal: 4 (45/99/77 4142)  Complications: No apparent anesthesia complications

## 2018-09-16 NOTE — Anesthesia Procedure Notes (Signed)
Date/Time: 09/16/2018 5:15 PM Performed by: Cynda Familia, CRNA Oxygen Delivery Method: Simple face mask Placement Confirmation: positive ETCO2 and breath sounds checked- equal and bilateral Dental Injury: Teeth and Oropharynx as per pre-operative assessment

## 2018-09-16 NOTE — Anesthesia Procedure Notes (Signed)
Anesthesia Regional Block: Interscalene brachial plexus block   Pre-Anesthetic Checklist: ,, timeout performed, Correct Patient, Correct Site, Correct Laterality, Correct Procedure, Correct Position, site marked, Risks and benefits discussed,  Surgical consent,  Pre-op evaluation,  At surgeon's request and post-op pain management  Laterality: Left  Prep: chloraprep       Needles:  Injection technique: Single-shot  Needle Type: Echogenic Stimulator Needle     Needle Length: 9cm  Needle Gauge: 21     Additional Needles:   Procedures:,,,, ultrasound used (permanent image in chart),,,,  Narrative:  Start time: 09/16/2018 1:20 PM End time: 09/16/2018 1:30 PM Injection made incrementally with aspirations every 5 mL.  Performed by: Personally  Anesthesiologist: Effie Berkshire, MD  Additional Notes: Patient tolerated the procedure well. Local anesthetic introduced in an incremental fashion under minimal resistance after negative aspirations. No paresthesias were elicited. After completion of the procedure, no acute issues were identified and patient continued to be monitored by RN.

## 2018-09-16 NOTE — Anesthesia Preprocedure Evaluation (Addendum)
Anesthesia Evaluation  Patient identified by MRN, date of birth, ID band Patient awake    Reviewed: Allergy & Precautions, NPO status , Patient's Chart, lab work & pertinent test results  Airway Mallampati: III  TM Distance: >3 FB Neck ROM: Full    Dental  (+) Teeth Intact, Dental Advisory Given   Pulmonary former smoker,    breath sounds clear to auscultation       Cardiovascular negative cardio ROS   Rhythm:Regular Rate:Normal     Neuro/Psych  Headaches, negative psych ROS   GI/Hepatic negative GI ROS, Neg liver ROS,   Endo/Other  negative endocrine ROS  Renal/GU negative Renal ROS     Musculoskeletal negative musculoskeletal ROS (+)   Abdominal (+) + obese,   Peds  Hematology negative hematology ROS (+)   Anesthesia Other Findings   Reproductive/Obstetrics                            Anesthesia Physical Anesthesia Plan  ASA: II  Anesthesia Plan: General   Post-op Pain Management: GA combined w/ Regional for post-op pain   Induction: Intravenous  PONV Risk Score and Plan: 4 or greater and Ondansetron, Dexamethasone, Midazolam and Scopolamine patch - Pre-op  Airway Management Planned: Oral ETT  Additional Equipment: None  Intra-op Plan:   Post-operative Plan: Extubation in OR  Informed Consent: I have reviewed the patients History and Physical, chart, labs and discussed the procedure including the risks, benefits and alternatives for the proposed anesthesia with the patient or authorized representative who has indicated his/her understanding and acceptance.     Dental advisory given  Plan Discussed with: CRNA  Anesthesia Plan Comments:        Anesthesia Quick Evaluation

## 2018-09-16 NOTE — Anesthesia Postprocedure Evaluation (Signed)
Anesthesia Post Note  Patient: Shelby Frederick  Procedure(s) Performed: OPEN REDUCTION INTERNAL FIXATION (ORIF) LEFT PROXIMAL HUMERUS FRACTURE (Left Arm Upper)     Patient location during evaluation: PACU Anesthesia Type: General Level of consciousness: awake and alert Pain management: pain level controlled Vital Signs Assessment: post-procedure vital signs reviewed and stable Respiratory status: spontaneous breathing, nonlabored ventilation, respiratory function stable and patient connected to nasal cannula oxygen Cardiovascular status: blood pressure returned to baseline and stable Postop Assessment: no apparent nausea or vomiting Anesthetic complications: no    Last Vitals:  Vitals:   09/16/18 1815 09/16/18 1830  BP: 113/71 137/85  Pulse: 83 89  Resp: 13 15  Temp: 36.7 C 36.5 C  SpO2: 98% 97%    Last Pain:  Vitals:   09/16/18 1830  TempSrc: Oral  PainSc: 3                  Effie Berkshire

## 2018-09-16 NOTE — H&P (Signed)
Shelby Frederick    Chief Complaint: displaced 3 part left proximal humerus fracture HPI: The patient is a 48 y.o. female with displaced left 3 part proximal humerus fracture  Past Medical History:  Diagnosis Date  . DVT (deep venous thrombosis) ([place])   . Headache    migraines    Past Surgical History:  Procedure Laterality Date  . EYE SURGERY     bilateral cataract surgery with lens implant-at age 61  . VEIN BYPASS SURGERY      History reviewed. No pertinent family history.  Social History:  reports that she quit smoking about 3 years ago. Her smoking use included cigarettes. She smoked 0.50 packs per day. She has never used smokeless tobacco. She reports that she does not drink alcohol or use drugs.   Medications Prior to Admission  Medication Sig Dispense Refill  . cyclobenzaprine (FLEXERIL) 10 MG tablet Take 10 mg by mouth 3 (three) times daily as needed for muscle spasms.    Marland Kitchen docusate sodium (COLACE) 100 MG capsule Take 200 mg by mouth daily.    Marland Kitchen HYDROcodone-acetaminophen (NORCO/VICODIN) 5-325 MG per tablet Take 1 tablet by mouth every 6 (six) hours as needed for moderate pain.     Marland Kitchen oxybutynin (DITROPAN) 5 MG tablet Take 5 mg by mouth 2 (two) times daily.    . rivaroxaban (XARELTO) 20 MG TABS tablet Take 20 mg by mouth daily with supper.    . sertraline (ZOLOFT) 100 MG tablet Take 100 mg by mouth daily.    Marland Kitchen topiramate (TOPAMAX) 100 MG tablet Take 100 mg by mouth 2 (two) times daily.    Marland Kitchen ezetimibe (ZETIA) 10 MG tablet Take 10 mg by mouth daily.       Physical Exam: left shoulder with painful and restricted motion as noted at recent office visit. Grossly N/ V intact  Vitals  Temp:  [98.2 F (36.8 C)] 98.2 F (36.8 C) (01/14 1216) Pulse Rate:  [75-93] 75 (01/14 1400) Resp:  [13-23] 17 (01/14 1400) BP: (106-126)/(67-82) 106/67 (01/14 1400) SpO2:  [93 %-99 %] 93 % (01/14 1400) Weight:  [96.6 kg] 96.6 kg (01/14 1232)  Assessment/Plan  Impression: displaced 3 part  left proximal humerus fracture  Plan of Action: Procedure(s): OPEN REDUCTION INTERNAL FIXATION (ORIF) LEFT PROXIMAL HUMERUS FRACTURE  Shelby Frederick M Shelby Frederick 09/16/2018, 2:48 PM Contact # [phone]

## 2018-09-17 DIAGNOSIS — S42232A 3-part fracture of surgical neck of left humerus, initial encounter for closed fracture: Secondary | ICD-10-CM | POA: Diagnosis not present

## 2018-09-17 LAB — GLUCOSE, CAPILLARY: Glucose-Capillary: 97 mg/dL (ref 70–99)

## 2018-09-17 MED ORDER — CYCLOBENZAPRINE HCL 10 MG PO TABS: 10.0000 mg | ORAL_TABLET | Freq: Three times a day (TID) | ORAL | 0 refills | Status: AC | PRN

## 2018-09-17 MED ORDER — OXYCODONE-ACETAMINOPHEN 5-325 MG PO TABS: 1.0000 | ORAL_TABLET | ORAL | 0 refills | Status: AC | PRN

## 2018-09-17 MED ORDER — ONDANSETRON HCL 4 MG PO TABS: 4.0000 mg | ORAL_TABLET | Freq: Three times a day (TID) | ORAL | 0 refills | Status: AC | PRN

## 2018-09-17 NOTE — Progress Notes (Signed)
RN reviewed discharge instructions with patient and family. All questions answered.   Paperwork and prescriptions given.   RN walked with patient down to family car. Family carried all belongings to car.

## 2018-09-17 NOTE — Progress Notes (Signed)
Medicated with Oxy IR 5mg  for incisional burning. Unable to have Tylenol at this time.

## 2018-09-17 NOTE — Evaluation (Signed)
Occupational Therapy Evaluation Patient Details Name: Shelby Frederick MRN: [id number] DOB: May 17, 1971 Today's Date: 09/17/2018    History of Present Illness S/p L ORIF for displaced 3 part proximal humerus fx   Clinical Impression   This 48 year old female was admitted for the above. All education was completed. She will follow up with Dr supple for further therapy.    Follow Up Recommendations  Follow surgeon's recommendation for DC plan and follow-up therapies    Equipment Recommendations  None recommended by OT    Recommendations for Other Services       Precautions / Restrictions Precautions Precautions: Shoulder Type of Shoulder Precautions: during adls only, PROM FF 60; abd 45; ER 10.  elbow, hand, wrist ROM allowed. Dangle allowed, NO pendulums.  Sling x for adls Precaution Booklet Issued: Yes (comment) Restrictions Weight Bearing Restrictions: Yes LUE Weight Bearing: Non weight bearing      Mobility Bed Mobility Overal bed mobility: Modified Independent             General bed mobility comments: HOB raised  Transfers Overall transfer level: Independent                    Balance Overall balance assessment: No apparent balance deficits (not formally assessed)                                         ADL either performed or assessed with clinical judgement   ADL Overall ADL's : Needs assistance/impaired Eating/Feeding: Set up   Grooming: Minimal assistance   Upper Body Bathing: Minimal assistance;Moderate assistance   Lower Body Bathing: Minimal assistance   Upper Body Dressing : Minimal assistance;Moderate assistance   Lower Body Dressing: Moderate assistance   Toilet Transfer: Supervision/safety   Toileting- Clothing Manipulation and Hygiene: Independent         General ADL Comments: performed ADL.  Pt wears camisole which she dons from feet up, ignoring L strap.  Needed increased assistance due to block still  working. Pt stabilized items with L hand. Instructed on Passively limitations during adls only.  handout given. Pt verbalized understanding of all     Vision         Perception     Praxis      Pertinent Vitals/Pain Pain Assessment: No/denies pain(block in effect)     Hand Dominance Left   Extremity/Trunk Assessment Upper Extremity Assessment Upper Extremity Assessment: LUE deficits/detail LUE Deficits / Details: block in effect; able to partially move L fingers           Communication Communication Communication: No difficulties   Cognition Arousal/Alertness: Awake/alert Behavior During Therapy: WFL for tasks assessed/performed Overall Cognitive Status: Within Functional Limits for tasks assessed                                     General Comments       Exercises     Shoulder Instructions      Home Living Family/patient expects to be discharged to:: Private residence Living Arrangements: Children;Parent Available Help at Discharge: Family               Bathroom Shower/Tub: Teaching laboratory technician Toilet: Standard     Home Equipment: None          Prior Functioning/Environment Level  of Independence: Needs assistance        Comments: since injury, 71 y o daughter has been assisting; present for eval        OT Problem List:        OT Treatment/Interventions:      OT Goals(Current goals can be found in the care plan section) Acute Rehab OT Goals OT Goal Formulation: All assessment and education complete, DC therapy  OT Frequency:     Barriers to D/C:            Co-evaluation              AM-PAC OT "6 Clicks" Daily Activity     Outcome Measure Help from another person eating meals?: A Little Help from another person taking care of personal grooming?: A Little Help from another person toileting, which includes using toliet, bedpan, or urinal?: A Little Help from another person bathing (including washing,  rinsing, drying)?: A Little Help from another person to put on and taking off regular upper body clothing?: A Little Help from another person to put on and taking off regular lower body clothing?: A Lot 6 Click Score: 17   End of Session    Activity Tolerance: Patient tolerated treatment well Patient left: in bed;with call bell/phone within reach;with family/visitor present  OT Visit Diagnosis: Muscle weakness (generalized) (M62.81)                Time: 1443-1540 OT Time Calculation (min): 33 min Charges:  OT General Charges $OT Visit: 1 Visit OT Evaluation $OT Eval Low Complexity: 1 Low OT Treatments $Self Care/Home Management : 8-22 mins  Lesle Chris, OTR/L Acute Rehabilitation Services [phone] [facility] pager [phone] office 09/17/2018  [place] 09/17/2018, 10:40 AM

## 2018-09-17 NOTE — Discharge Summary (Signed)
PATIENT ID:      Shelby Frederick  MRN:     [id number] DOB/AGE:    50/06/1971 / 48 y.o.     DISCHARGE SUMMARY  ADMISSION DATE:    09/16/2018 DISCHARGE DATE:    ADMISSION DIAGNOSIS: displaced 3 part left proximal humerus fracture Past Medical History:  Diagnosis Date  . DVT (deep venous thrombosis) ([place])   . Headache    migraines    DISCHARGE DIAGNOSIS:   Active Problems:   Proximal humerus fracture   PROCEDURE: Procedure(s): OPEN REDUCTION INTERNAL FIXATION (ORIF) LEFT PROXIMAL HUMERUS FRACTURE on 09/16/2018  CONSULTS:    HISTORY:  See H&P in chart.  HOSPITAL COURSE:  Shelby Frederick is a 48 y.o. admitted on 09/16/2018 with a diagnosis of displaced 3 part left proximal humerus fracture.  They were brought to the operating room on 09/16/2018 and underwent Procedure(s): OPEN REDUCTION INTERNAL FIXATION (ORIF) LEFT PROXIMAL HUMERUS FRACTURE.    They were given perioperative antibiotics:  Anti-infectives (From admission, onward)   Start     Dose/Rate Route Frequency Ordered Stop   09/17/18 0600  ceFAZolin (ANCEF) IVPB 2g/100 mL premix  Status:  Discontinued     2 g 200 mL/hr over 30 Minutes Intravenous On call to O.R. 09/16/18 1200 09/16/18 1208   09/16/18 1204  ceFAZolin (ANCEF) 2-4 GM/100ML-% IVPB    Note to Pharmacy:  [place] Session   : cabinet override      09/16/18 1204 09/16/18 1515   09/16/18 1200  ceFAZolin (ANCEF) IVPB 2g/100 mL premix     2 g 200 mL/hr over 30 Minutes Intravenous On call to O.R. 09/16/18 1159 09/16/18 1515    .  Patient underwent the above named procedure and tolerated it well. The following day they were hemodynamically stable and pain was controlled on oral analgesics. They were neurovascularly intact to the operative extremity. OT was ordered and worked with patient per protocol. They were medically and orthopaedically stable for discharge on day 1 .     DIAGNOSTIC STUDIES:  RECENT RADIOGRAPHIC STUDIES :  Dg Humerus Left  Result Date:  09/16/2018 CLINICAL DATA:  Operative fixation of a left humerus fracture. EXAM: LEFT HUMERUS - 2+ VIEW; DG C-ARM 1-60 MIN-NO REPORT COMPARISON:  None. FINDINGS: 3 C-arm views of the left shoulder demonstrate screw and plate fixation of a left humeral neck fracture with minimal medial displacement of the distal fragment. IMPRESSION: Hardware fixation of a left humeral neck fracture. Electronically Signed   By: Claudie Revering M.D.   On: 09/16/2018 17:56   Dg C-arm 1-60 Min-no Report  Result Date: 09/16/2018 Fluoroscopy was utilized by the requesting physician.  No radiographic interpretation.    RECENT VITAL SIGNS:   Patient Vitals for the past 24 hrs:  BP Temp Temp src Pulse Resp SpO2 Height Weight  09/17/18 0509 114/69 97.8 F (36.6 C) Oral 77 14 93 % - -  09/17/18 0110 97/61 97.7 F (36.5 C) Oral 66 16 94 % - -  09/16/18 2130 107/70 97.8 F (36.6 C) Axillary 76 16 91 % - -  09/16/18 2025 128/79 97.7 F (36.5 C) Axillary 91 16 98 % - -  09/16/18 1932 119/70 (!) 97.5 F (36.4 C) Oral 72 14 99 % - -  09/16/18 1830 137/85 97.7 F (36.5 C) Oral 89 15 97 % 5' 4.5" (1.638 m) 96.6 kg  09/16/18 1815 113/71 98.1 F (36.7 C) - 83 13 98 % - -  09/16/18 1800 109/68 - - 85 12 100 % - -  09/16/18 1745 128/76 - - 86 17 100 % - -  09/16/18 1730 125/77 - - 88 18 100 % - -  09/16/18 1720 127/71 97.6 F (36.4 C) - (!) 102 (!) 21 100 % - -  09/16/18 1400 106/67 - - 75 17 93 % - -  09/16/18 1329 - - - 83 17 96 % - -  09/16/18 1328 - - - 85 16 95 % - -  09/16/18 1327 - - - 88 20 96 % - -  09/16/18 1326 126/81 - - 88 19 96 % - -  09/16/18 1325 - - - 90 18 97 % - -  09/16/18 1324 - - - 92 16 97 % - -  09/16/18 1323 - - - 90 13 96 % - -  09/16/18 1322 - - - 87 16 96 % - -  09/16/18 1321 122/76 - - 85 16 93 % - -  09/16/18 1320 - - - 86 16 95 % - -  09/16/18 1319 - - - 85 16 95 % - -  09/16/18 1318 - - - 88 18 96 % - -  09/16/18 1317 - - - 90 19 98 % - -  09/16/18 1316 126/74 - - 87 (!) 23 99 % - -   09/16/18 1315 - - - 90 16 98 % - -  09/16/18 1314 - - - 88 16 97 % - -  09/16/18 1313 - - - 93 18 96 % - -  09/16/18 1312 - - - 88 18 96 % - -  09/16/18 1311 121/82 - - 89 20 97 % - -  09/16/18 1310 - - - 86 20 97 % - -  09/16/18 1232 - - - - - - 5' 4.5" (1.638 m) 96.6 kg  09/16/18 1216 112/77 98.2 F (36.8 C) Oral 87 16 98 % - -  .  RECENT EKG RESULTS:   No orders found for this or any previous visit.  DISCHARGE INSTRUCTIONS:    DISCHARGE MEDICATIONS:   Allergies as of 09/17/2018   No Known Allergies     Medication List    STOP taking these medications   HYDROcodone-acetaminophen 5-325 MG tablet Commonly known as:  NORCO/VICODIN     TAKE these medications   cyclobenzaprine 10 MG tablet Commonly known as:  FLEXERIL Take 1 tablet (10 mg total) by mouth 3 (three) times daily as needed for muscle spasms.   docusate sodium 100 MG capsule Commonly known as:  COLACE Take 200 mg by mouth daily.   ezetimibe 10 MG tablet Commonly known as:  ZETIA Take 10 mg by mouth daily.   ondansetron 4 MG tablet Commonly known as:  ZOFRAN Take 1 tablet (4 mg total) by mouth every 8 (eight) hours as needed for nausea or vomiting.   oxybutynin 5 MG tablet Commonly known as:  DITROPAN Take 5 mg by mouth 2 (two) times daily.   oxyCODONE-acetaminophen 5-325 MG tablet Commonly known as:  PERCOCET Take 1 tablet by mouth every 4 (four) hours as needed (max 6 q).   rivaroxaban 20 MG Tabs tablet Commonly known as:  XARELTO Take 20 mg by mouth daily with supper.   sertraline 100 MG tablet Commonly known as:  ZOLOFT Take 100 mg by mouth daily.   topiramate 100 MG tablet Commonly known as:  TOPAMAX Take 100 mg by mouth 2 (two) times daily.       FOLLOW UP VISIT:   Follow-up Information  Justice Britain, MD.   Specialty:  Orthopedic Surgery Why:  call to be seen in 10-14 days Contact information: [address] [place] [address] [phone]            DISCHARGE HQ:ITUY  DISCHARGE CONDITION:  [place] for Dr. Justice Britain 09/17/2018, 8:10 AM

## 2018-09-17 NOTE — Plan of Care (Signed)
Plan of care reviewed and discussed with the patient. 

## 2018-09-18 ENCOUNTER — Encounter (HOSPITAL_COMMUNITY): Payer: Self-pay | Admitting: Orthopedic Surgery

## 2019-08-10 IMAGING — RF DG HUMERUS 2V *L*
1 series · 3 of 3 positions shown · non-contrast
Comparison: None.

CLINICAL DATA: Operative fixation of a left humerus fracture.

EXAM:
LEFT HUMERUS - 2+ VIEW; DG C-ARM 1-60 MIN-NO REPORT

[Series 1: unknown protocol · 0.14mm/px · 3 of 3 slices shown]
[im 1/3]
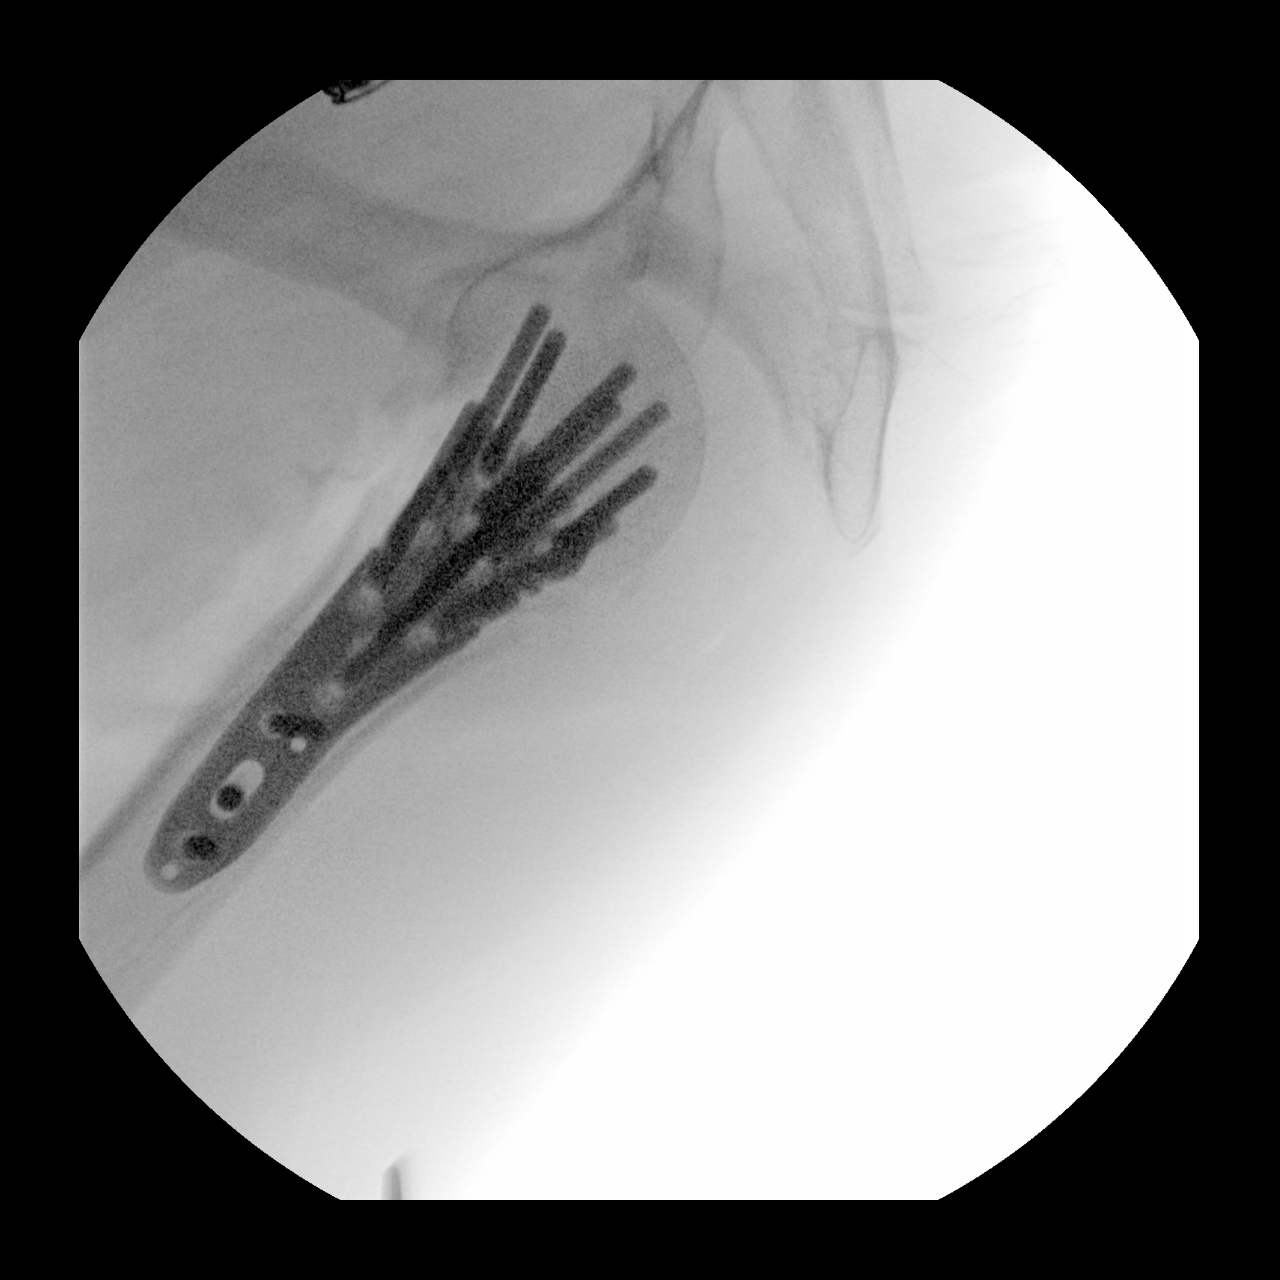
[im 2/3]
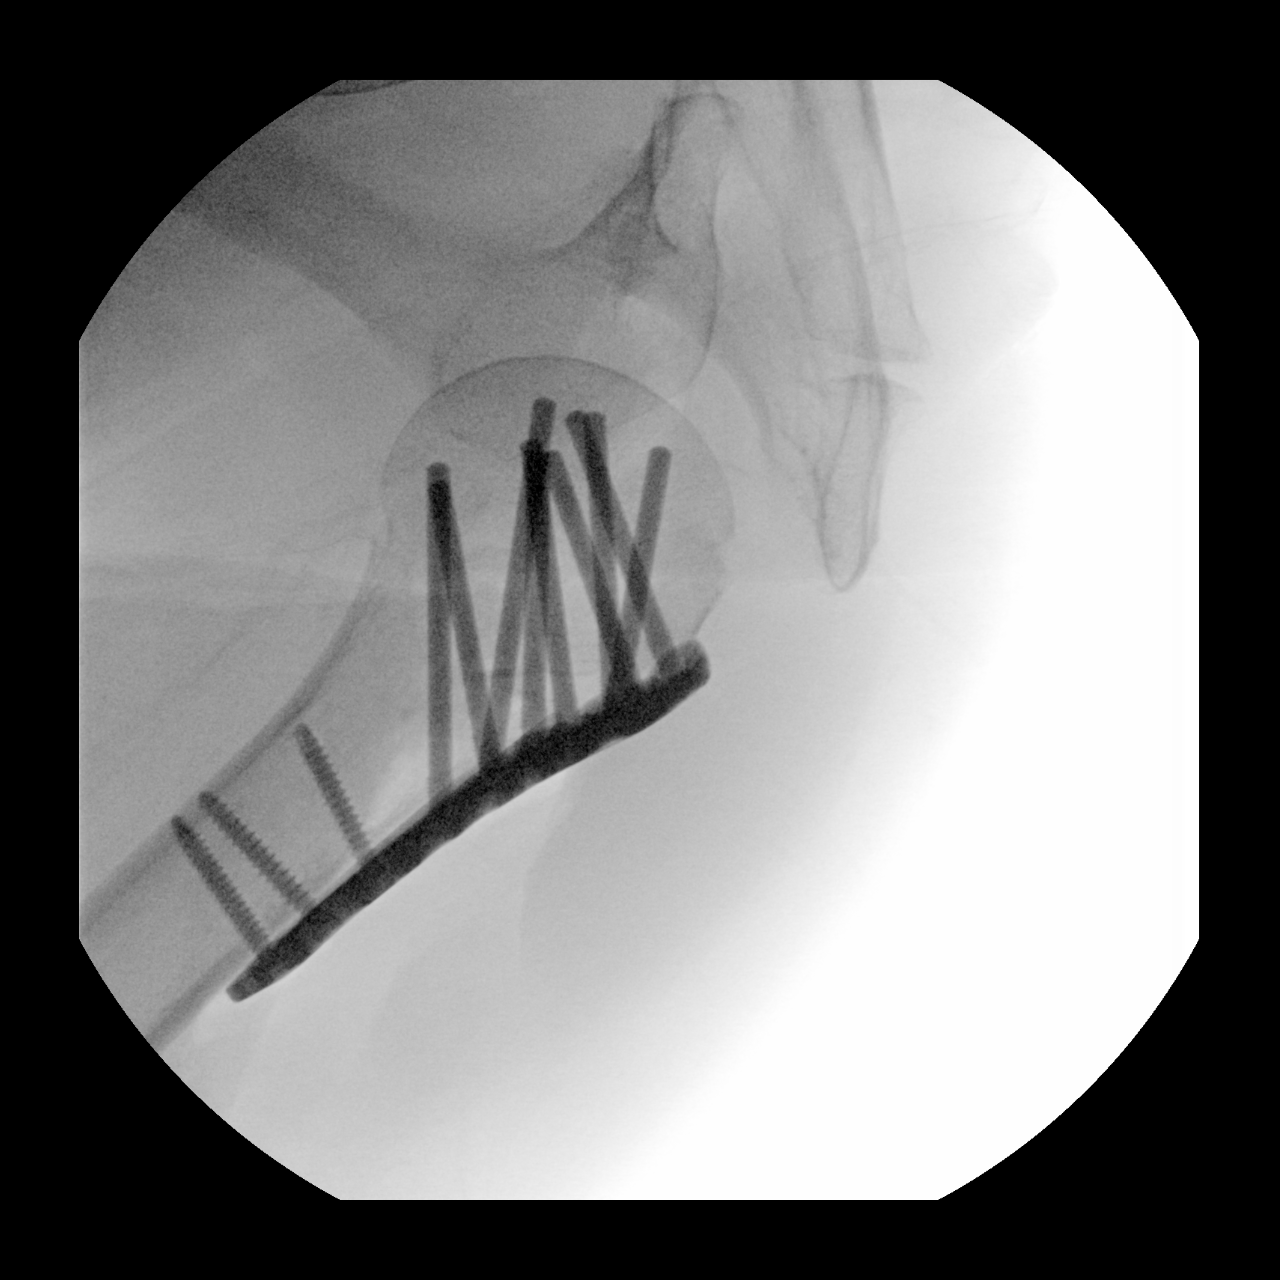
[im 3/3]
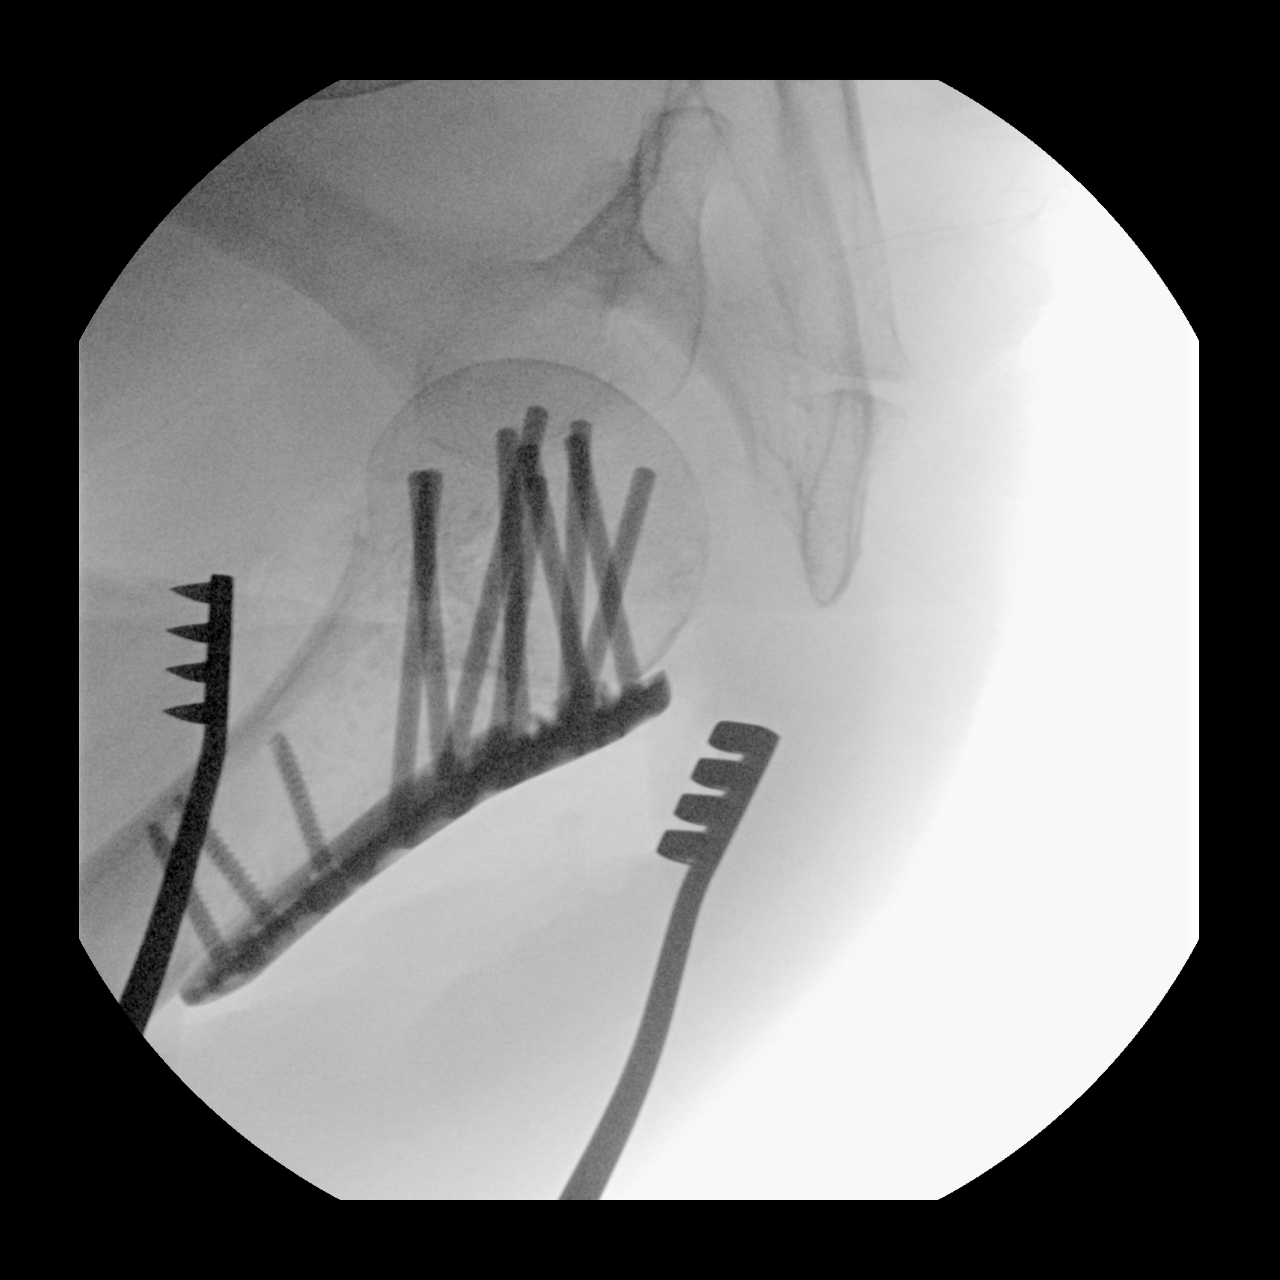

[3 of 3 positions shown; findings below may reference images not displayed]

FINDINGS: 3 C-arm views of the left shoulder demonstrate screw and plate
fixation of a left humeral neck fracture with minimal medial
displacement of the distal fragment.
IMPRESSION: Hardware fixation of a left humeral neck fracture.

## 2021-05-12 ENCOUNTER — Encounter: Payer: Self-pay | Admitting: Gastroenterology

## 2021-05-30 ENCOUNTER — Telehealth: Payer: Self-pay

## 2021-05-30 ENCOUNTER — Ambulatory Visit (INDEPENDENT_AMBULATORY_CARE_PROVIDER_SITE_OTHER): Payer: Medicare Other | Admitting: Gastroenterology

## 2021-05-30 ENCOUNTER — Encounter: Payer: Self-pay | Admitting: Gastroenterology

## 2021-05-30 VITALS — BP 134/80 | HR 85 | Ht 64.0 in | Wt 253.4 lb

## 2021-05-30 DIAGNOSIS — R112 Nausea with vomiting, unspecified: Secondary | ICD-10-CM | POA: Diagnosis not present

## 2021-05-30 DIAGNOSIS — R140 Abdominal distension (gaseous): Secondary | ICD-10-CM

## 2021-05-30 DIAGNOSIS — R194 Change in bowel habit: Secondary | ICD-10-CM | POA: Diagnosis not present

## 2021-05-30 DIAGNOSIS — R1032 Left lower quadrant pain: Secondary | ICD-10-CM | POA: Diagnosis not present

## 2021-05-30 DIAGNOSIS — Z7902 Long term (current) use of antithrombotics/antiplatelets: Secondary | ICD-10-CM

## 2021-05-30 MED ORDER — GOLYTELY 236 G PO SOLR: 4000.0000 mL | Freq: Once | ORAL | 0 refills | Status: AC

## 2021-05-30 NOTE — Telephone Encounter (Signed)
Pre-operative Risk Assessment     Request for surgical clearance:     Endoscopy Procedure  What type of surgery is being performed?     Endoscopy/Colonoscopy  When is this surgery scheduled?     06/14/2021  What type of clearance is required ?   Pharmacy  Are there any medications that need to be held prior to surgery and how long? Xarelto starting 2 days prior  Practice name and name of physician performing surgery?      [facility]  What is your office phone and fax number?      Phone- [phone]  [phone]  Anesthesia type (None, local, MAC, general) ?       MAC

## 2021-05-30 NOTE — Patient Instructions (Signed)
If you are age 52 or younger, your body mass index should be between 19-25. Your Body mass index is 43.49 kg/m. If this is out of the aformentioned range listed, please consider follow up with your Primary Care Provider.  __________________________________________________________  The [facility] providers would like to encourage you to use [facility] to communicate with providers for non-urgent requests or questions.  Due to long hold times on the telephone, sending your provider a message by [facility] may be a faster and more efficient way to get a response.  Please allow 48 business hours for a response.  Please remember that this is for non-urgent requests.   You have been scheduled for a colonoscopy. Please follow written instructions given to you at your visit today.  Please pick up your prep supplies at the pharmacy within the next 1-3 days. If you use inhalers (even only as needed), please bring them with you on the day of your procedure.  You will be contacted by our office prior to your procedure for directions on holding your Xarelto.  If you do not hear from our office 1 week prior to your scheduled procedure, please call [phone] to discuss.   Follow up pending the results of your Endoscopy/Colonoscopy.   It was a pleasure to see you today!  Thank you for trusting me with your gastrointestinal care!

## 2021-05-30 NOTE — Progress Notes (Signed)
[facility] Consult Note:  History: Shelby Frederick 05/30/2021  Referring provider: Abigail Miyamoto, [place] ([facility] the [place], [place], [place])  Reason for consult/chief complaint: Colonoscopy (Patient is here to discuss Colonoscopy. ), Gas (Belching and flatulence the causes a sharp LLQ pain. ), and Change in bowel habits (Patient states bowels are loose and dark brown almost black.)   Subjective  HPI:  From PCP office note dated 03/14/2021: " Patient seen in clinic today status post ER visit.  She was prescribed Flagyl and Cipro for diverticulitis but interestingly the CT scan did not reveal diverticulitis it revealed diverticulosis.  Nonetheless the patient is still having some left lower quadrant pain loose stools every day bloating increased belching and flatus.  She is 50 years old and is due for screening colonoscopy so I am going to refer her for this."   Bijal was accompanied by her sister today, who was present for the entire visit.  She describes relatively acute onset of left-sided abdominal pain with a change in bowel habits in June of this year.  The pain has been intermittent, not as severe as it was during the ED visit.  It is a sharp pain across the left lower abdomen, though sometimes also in the left upper.  Bowel habits have changed with her stool either small pieces difficult to pass or intermittently loose and sometimes dark and almost black.  She has frequent belching, flatulence, nausea and vomiting.  No new medicines or other illnesses or clear triggers for the onset of the symptoms.  No rectal bleeding. ROS:  Review of Systems  Constitutional:  Negative for appetite change and unexpected weight change.  HENT:  Negative for mouth sores and voice change.   Eyes:  Negative for pain and redness.  Respiratory:  Negative for cough and shortness of breath.   Cardiovascular:  Negative for chest pain and palpitations.  Genitourinary:   Negative for dysuria and hematuria.  Musculoskeletal:  Positive for back pain and myalgias. Negative for arthralgias.  Skin:  Negative for pallor and rash.  Neurological:  Negative for weakness and headaches.  Hematological:  Negative for adenopathy.    Past Medical History: Past Medical History:  Diagnosis Date   Diverticulitis    DVT (deep venous thrombosis) (HCC)    Headache    migraines   PAD (peripheral artery disease) (HCC)    Urinary incontinence    PCP note indicates patient had a history of a right lesser saphenous vein thrombosis status post venous ablation procedure with [facility] vascular surgery. 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  Past Surgical History: Past Surgical History:  Procedure Laterality Date   EYE SURGERY     bilateral cataract surgery with lens implant-at age 79   ORIF HUMERUS FRACTURE Left 09/16/2018   Procedure: OPEN REDUCTION INTERNAL FIXATION (ORIF) LEFT PROXIMAL HUMERUS FRACTURE;  Surgeon: Justice Britain, MD;  Location: [facility]S;  Service: Orthopedics;  Laterality: Left;   VEIN BYPASS SURGERY       Family History: Family History  Problem Relation Age of Onset   Colon cancer Neg Hx    Stomach cancer Neg Hx    Esophageal cancer Neg Hx    Pancreatic cancer Neg Hx     Social History: Social History   Socioeconomic History   Marital status: Divorced    Spouse name: Not on file   Number of children: Not on file   Years  of education: Not on file   Highest education level: Not on file  Occupational History   Not on file  Tobacco Use   Smoking status: Former    Packs/day: 0.50    Types: Cigarettes    Quit date: 10/19/2014    Years since quitting: 6.6   Smokeless tobacco: Never  Vaping Use   Vaping Use: Never used  Substance and Sexual Activity   Alcohol use: No   Drug use: No   Sexual activity: Not on file  Other Topics Concern   Not on file  Social History Narrative    Not on file   Social Determinants of Health   Financial Resource Strain: Not on file  Food Insecurity: Not on file  Transportation Needs: Not on file  Physical Activity: Not on file  Stress: Not on file  Social Connections: Not on file    Allergies: No Known Allergies  Outpatient Meds: Current Outpatient Medications  Medication Sig Dispense Refill   cyclobenzaprine (FLEXERIL) 10 MG tablet Take 1 tablet (10 mg total) by mouth 3 (three) times daily as needed for muscle spasms. 30 tablet 0   docusate sodium (COLACE) 100 MG capsule Take 200 mg by mouth daily.     ezetimibe (ZETIA) 10 MG tablet Take 10 mg by mouth daily.     ondansetron (ZOFRAN) 4 MG tablet Take 1 tablet (4 mg total) by mouth every 8 (eight) hours as needed for nausea or vomiting. 10 tablet 0   oxybutynin (DITROPAN) 5 MG tablet Take 5 mg by mouth 2 (two) times daily.     oxyCODONE-acetaminophen (PERCOCET) 5-325 MG tablet Take 1 tablet by mouth every 4 (four) hours as needed (max 6 q). 30 tablet 0   polyethylene glycol (GOLYTELY) 236 g solution Take 4,000 mLs by mouth once for 1 dose. 4000 mL 0   rivaroxaban (XARELTO) 20 MG TABS tablet Take 20 mg by mouth daily with supper.     sertraline (ZOLOFT) 100 MG tablet Take 100 mg by mouth daily.     topiramate (TOPAMAX) 100 MG tablet Take 100 mg by mouth 2 (two) times daily.     No current facility-administered medications for this visit.   PCP stopped Zetia at least 6 months ago ____________________________   Alveda Reasons is prescribed by Dr Christell Faith at [facility] (for PAD) From their last note June 2022:  "IMPRESSION AND PLAN: Ms. Lorelle Formosa returns for a followup visit. I discussed with the patient the findings of arterial and venous study, which were performed earlier today, which confirmed relatively normal circulation to lower extremities, as related to PAD, significant venous insufficiency of first left leg, both in deep and superficial. Following the latter  findings, I advised the patient to start wearing 8-15 mm compression pantyhose. The patient received brochure from [employer] where I suggested to get the stockings from. Instructions for ordering were provided. Per patient request, we will see her back in 4-5 months. Today, I spent more than 30 minutes, including more than 25 minutes in direct contact with the patient. Questions answered. New information reviewed. The patient was asked to call me anytime if she has questions or concerns"    ___________________________________________________________________ Objective   Exam:  BP 134/80   Pulse 85   Ht 5\' 4"  (1.626 m)   Wt 253 lb 6 oz (114.9 kg)   LMP 02/05/2013   BMI 43.49 kg/m  Wt Readings from Last 3 Encounters:  05/30/21 253 lb 6 oz (114.9 kg)  09/16/18 213  lb (96.6 kg)  09/15/18 210 lb (95.3 kg)    General: Pleasant and conversational, antalgic gait, gets on exam table without assistance.  Not dyspneic at rest or with movement. Eyes: sclera anicteric, no redness ENT: oral mucosa moist without lesions, no cervical or supraclavicular lymphadenopathy CV: RRR without murmur, S1/S2, no JVD, no peripheral edema.  Venous stasis changes both lower legs and feet Resp: clear to auscultation bilaterally, normal RR and effort noted GI: soft, LUQ and LLQ tenderness including toward groin and left flank, with active bowel sounds. No guarding or palpable organomegaly noted, limited by body habitus Skin; warm and dry, no rash or jaundice noted Neuro: awake, alert and oriented x 3. Normal gross motor function and fluent speech She has a long scar along the right forearm reportedly from a vein harvest for lower extremity bypass  Labs:  Labs with PCP office dated 03/14/2021:  H. pylori IgM antibody negative  Negative IgE antibody panel to certain meats, grains and dairy  02/22/2021 CBC and CMP normal Normal TSH March 2022   Assessment: Encounter Diagnoses  Name Primary?   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  Difficult to tell how or if these are related in a single diagnosis.  CT scan during ED visit in June reportedly had diverticulosis but no diverticulitis.  Patient recalls no improvement after short course antibiotics.  Plan:  Upper endoscopy and colonoscopy. She was agreeable after discussion of procedure and risks  The benefits and risks of the planned procedure were described in detail with the patient or (when appropriate) their health care proxy.  Risks were outlined as including, but not limited to, bleeding, infection, perforation, adverse medication reaction leading to cardiac or pulmonary decompensation, pancreatitis (if ERCP).  The limitation of incomplete mucosal visualization was also discussed.  No guarantees or warranties were given.  Patient at increased risk for cardiopulmonary complications of procedure due to medical comorbidities.  (Obesity, vascular disease, long-term OAC)  We need to hold Xarelto 2 days prior to procedure, and will clear with her vascular surgeon.  Lyrique says she has been able to hold it up to 3 days in the past for prior procedures.  She understands the small but real risk of thrombosis during the time off Xarelto.  (Medically complex, extensive chart review required) Thank you for the courtesy of this consult.  Please call me with any questions or concerns.  Nelida Meuse III  CC: Referring provider noted above

## 2021-06-09 NOTE — Telephone Encounter (Signed)
Called Dr. Shirlyn Goltz office for update on clearance

## 2021-06-12 NOTE — Telephone Encounter (Signed)
Call Dr. Shirlyn Goltz office again for an update. Patient has been contacted and advised that I am still waiting to hear back

## 2021-06-13 NOTE — Telephone Encounter (Signed)
Shelby Frederick from Dr. Shirlyn Goltz office called this morning.  He approved pt. holding the Xarelto for three days.

## 2021-06-13 NOTE — Telephone Encounter (Signed)
Patient has been notified to stop her Xarelto and that Dr. Loletha Carrow will let her know when to restart. She did not take her evening dose yesterday.

## 2021-06-14 ENCOUNTER — Ambulatory Visit (AMBULATORY_SURGERY_CENTER): Payer: Medicare Other | Admitting: Gastroenterology

## 2021-06-14 ENCOUNTER — Other Ambulatory Visit: Payer: Self-pay

## 2021-06-14 ENCOUNTER — Encounter: Payer: Self-pay | Admitting: Gastroenterology

## 2021-06-14 VITALS — BP 121/85 | HR 70 | Temp 98.7°F | Resp 23 | Ht 64.0 in | Wt 253.0 lb

## 2021-06-14 DIAGNOSIS — R194 Change in bowel habit: Secondary | ICD-10-CM | POA: Diagnosis not present

## 2021-06-14 DIAGNOSIS — K449 Diaphragmatic hernia without obstruction or gangrene: Secondary | ICD-10-CM | POA: Diagnosis not present

## 2021-06-14 DIAGNOSIS — K222 Esophageal obstruction: Secondary | ICD-10-CM | POA: Diagnosis not present

## 2021-06-14 DIAGNOSIS — R1032 Left lower quadrant pain: Secondary | ICD-10-CM

## 2021-06-14 DIAGNOSIS — K5730 Diverticulosis of large intestine without perforation or abscess without bleeding: Secondary | ICD-10-CM

## 2021-06-14 DIAGNOSIS — R112 Nausea with vomiting, unspecified: Secondary | ICD-10-CM

## 2021-06-14 DIAGNOSIS — D122 Benign neoplasm of ascending colon: Secondary | ICD-10-CM

## 2021-06-14 DIAGNOSIS — D120 Benign neoplasm of cecum: Secondary | ICD-10-CM

## 2021-06-14 MED ORDER — SODIUM CHLORIDE 0.9 % IV SOLN: 500.0000 mL | INTRAVENOUS | Status: DC

## 2021-06-14 NOTE — Progress Notes (Signed)
Called to room to assist during endoscopic procedure.  Patient ID and intended procedure confirmed with present staff. Received instructions for my participation in the procedure from the performing physician.  

## 2021-06-14 NOTE — Progress Notes (Signed)
Report to PACU, RN, vss, BBS= Clear.  

## 2021-06-14 NOTE — Patient Instructions (Signed)
Discharge instructions given. Handouts on polyps,Diverticulosis and a Hiatal Hernia. Resume Xarelto at prior dose tomorrow. Resume other medications. YOU HAD AN ENDOSCOPIC PROCEDURE TODAY AT [facility]:   Refer to the procedure report that was given to you for any specific questions about what was found during the examination.  If the procedure report does not answer your questions, please call your gastroenterologist to clarify.  If you requested that your care partner not be given the details of your procedure findings, then the procedure report has been included in a sealed envelope for you to review at your convenience later.  YOU SHOULD EXPECT: Some feelings of bloating in the abdomen. Passage of more gas than usual.  Walking can help get rid of the air that was put into your GI tract during the procedure and reduce the bloating. If you had a lower endoscopy (such as a colonoscopy or flexible sigmoidoscopy) you may notice spotting of blood in your stool or on the toilet paper. If you underwent a bowel prep for your procedure, you may not have a normal bowel movement for a few days.  Please Note:  You might notice some irritation and congestion in your nose or some drainage.  This is from the oxygen used during your procedure.  There is no need for concern and it should clear up in a day or so.  SYMPTOMS TO REPORT IMMEDIATELY:  Following lower endoscopy (colonoscopy or flexible sigmoidoscopy):  Excessive amounts of blood in the stool  Significant tenderness or worsening of abdominal pains  Swelling of the abdomen that is new, acute  Fever of 100F or higher  Following upper endoscopy (EGD)  Vomiting of blood or coffee ground material  New chest pain or pain under the shoulder blades  Painful or persistently difficult swallowing  New shortness of breath  Fever of 100F or higher  Black, tarry-looking stools  For urgent or emergent issues, a gastroenterologist can be  reached at any hour by calling [phone]. Do not use MyChart messaging for urgent concerns.    DIET:  We do recommend a small meal at first, but then you may proceed to your regular diet.  Drink plenty of fluids but you should avoid alcoholic beverages for 24 hours.  ACTIVITY:  You should plan to take it easy for the rest of today and you should NOT DRIVE or use heavy machinery until tomorrow (because of the sedation medicines used during the test).    FOLLOW UP: Our staff will call the number listed on your records 48-72 hours following your procedure to check on you and address any questions or concerns that you may have regarding the information given to you following your procedure. If we do not reach you, we will leave a message.  We will attempt to reach you two times.  During this call, we will ask if you have developed any symptoms of COVID 19. If you develop any symptoms (ie: fever, flu-like symptoms, shortness of breath, cough etc.) before then, please call [phone].  If you test positive for Covid 19 in the 2 weeks post procedure, please call and report this information to [place].    If any biopsies were taken you will be contacted by phone or by letter within the next 1-3 weeks.  Please call us at [phone] if you have not heard about the biopsies in 3 weeks.    SIGNATURES/CONFIDENTIALITY: You and/or your care partner have signed paperwork which will be entered into  your electronic medical record.  These signatures attest to the fact that that the information above on your After Visit Summary has been reviewed and is understood.  Full responsibility of the confidentiality of this discharge information lies with you and/or your care-partner.

## 2021-06-14 NOTE — Op Note (Signed)
[facility] Patient Name: Shelby Frederick Procedure Date: 06/14/2021 3:06 PM MRN: [id number] Endoscopist: Mallie Mussel L. Loletha Carrow , MD Age: 50 Referring MD:  Date of Birth: 04/18/1971 Gender: Female Account #: [id number] Procedure:                Upper GI endoscopy Indications:              Nausea with vomiting Medicines:                Monitored Anesthesia Care Procedure:                Pre-Anesthesia Assessment:                           - Prior to the procedure, a History and Physical                            was performed, and patient medications and                            allergies were reviewed. The patient's tolerance of                            previous anesthesia was also reviewed. The risks                            and benefits of the procedure and the sedation                            options and risks were discussed with the patient.                            All questions were answered, and informed consent                            was obtained. Prior Anticoagulants: The patient has                            taken Xarelto (rivaroxaban), last dose was 2 days                            prior to procedure. ASA Grade Assessment: III - A                            patient with severe systemic disease. After                            reviewing the risks and benefits, the patient was                            deemed in satisfactory condition to undergo the                            procedure.  After obtaining informed consent, the endoscope was                            passed under direct vision. Throughout the                            procedure, the patient's blood pressure, pulse, and                            oxygen saturations were monitored continuously. The                            Endoscope was introduced through the mouth, and                            advanced to the second part of duodenum. The upper                             GI endoscopy was accomplished without difficulty.                            The patient tolerated the procedure well. Scope In: Scope Out: Findings:                 A 1-2 cm sliding hiatal hernia was present.                           A non-obstructing Schatzki ring was found at the                            gastroesophageal junction.                           Normal mucosa was found in the entire examined                            stomach. Biopsies were taken with a cold forceps                            for histology (antrum and body).                           The examined duodenum was normal. Biopsies for                            histology were taken with a cold forceps for                            evaluation of celiac disease. Complications:            No immediate complications. Estimated Blood Loss:     Estimated blood loss was minimal. Impression:               - 1 cm hiatal hernia.                           -  Non-obstructing Schatzki ring.                           - Normal mucosa was found in the entire stomach.                            Biopsied.                           - Normal examined duodenum. Biopsied. Recommendation:           - Patient has a contact number available for                            emergencies. The signs and symptoms of potential                            delayed complications were discussed with the                            patient. Return to normal activities tomorrow.                            Written discharge instructions were provided to the                            patient.                           - Resume previous diet.                           - Resume Xarelto (rivaroxaban) at prior dose                            tomorrow.                           - Await pathology results.                           - See the other procedure note for documentation of                            additional recommendations. Shelby Frederick  L. Loletha Carrow, MD 06/14/2021 3:50:30 PM This report has been signed electronically.

## 2021-06-14 NOTE — Op Note (Signed)
[place] Patient Name: Shelby Frederick Procedure Date: 06/14/2021 3:06 PM MRN: [id number] Endoscopist: Mallie Mussel L. Loletha Carrow , MD Age: 50 Referring MD:  Date of Birth: [birth year] Gender: Female Account #: [id number] Procedure:                Colonoscopy Indications:              Abdominal pain in the left lower quadrant, Change                            in bowel habits Medicines:                Monitored Anesthesia Care Procedure:                Pre-Anesthesia Assessment:                           - Prior to the procedure, a History and Physical                            was performed, and patient medications and                            allergies were reviewed. The patient's tolerance of                            previous anesthesia was also reviewed. The risks                            and benefits of the procedure and the sedation                            options and risks were discussed with the patient.                            All questions were answered, and informed consent                            was obtained. Prior Anticoagulants: The patient has                            taken Xarelto (rivaroxaban), last dose was 2 days                            prior to procedure. ASA Grade Assessment: III - A                            patient with severe systemic disease. After                            reviewing the risks and benefits, the patient was                            deemed in satisfactory condition to undergo the  procedure.                           After obtaining informed consent, the colonoscope                            was passed under direct vision. Throughout the                            procedure, the patient's blood pressure, pulse, and                            oxygen saturations were monitored continuously. The                            CF [id number] #[id number] was introduced through the anus                             and advanced to the the terminal ileum, with                            identification of the appendiceal orifice and IC                            valve. The colonoscopy was performed without                            difficulty. The patient tolerated the procedure                            well. The quality of the bowel preparation was                            good. The terminal ileum, ileocecal valve,                            appendiceal orifice, and rectum were photographed.                            The bowel preparation used was GoLYTELY. Scope In: 3:17:11 PM Scope Out: 3:29:17 PM Scope Withdrawal Time: 0 hours 10 minutes 25 seconds  Total Procedure Duration: 0 hours 12 minutes 6 seconds  Findings:                 The perianal and digital rectal examinations were                            normal.                           The terminal ileum appeared normal.                           Normal mucosa was found in the entire colon.  Biopsies for histology were taken with a cold                            forceps from the right colon and left colon for                            evaluation of microscopic colitis.                           Multiple diverticula were found in the left colon.                           Two sessile polyps were found in the distal                            ascending colon and cecum. The polyps were 2 to 8                            mm in size. These polyps were removed with a cold                            snare. Resection and retrieval were complete.                           The exam was otherwise without abnormality on                            direct and retroflexion views. Complications:            No immediate complications. Estimated Blood Loss:     Estimated blood loss was minimal. Impression:               - The examined portion of the ileum was normal.                           - Normal mucosa in the entire  examined colon.                            Biopsied.                           - Diverticulosis in the left colon.                           - Two 2 to 8 mm polyps in the distal ascending                            colon and in the cecum, removed with a cold snare.                            Resected and retrieved.                           - The examination was otherwise normal on direct  and retroflexion views. Recommendation:           - Patient has a contact number available for                            emergencies. The signs and symptoms of potential                            delayed complications were discussed with the                            patient. Return to normal activities tomorrow.                            Written discharge instructions were provided to the                            patient.                           - Resume previous diet.                           - Continue present medications.                           - Resume Xarelto (rivaroxaban) at prior dose                            tomorrow.                           - Await pathology results.                           - Repeat colonoscopy is recommended for                            surveillance. The colonoscopy date will be                            determined after pathology results from today's                            exam become available for review.                           - See the other procedure note for documentation of                            additional recommendations. Cambrea Kirt L. Loletha Carrow, MD 06/14/2021 3:46:13 PM This report has been signed electronically.

## 2021-06-14 NOTE — Progress Notes (Signed)
No changes to clinical history since GI office visit on 05/30/21.  The patient is appropriate for an endoscopic procedure in the ambulatory setting.    

## 2021-06-16 ENCOUNTER — Telehealth: Payer: Self-pay | Admitting: *Deleted

## 2021-06-16 NOTE — Telephone Encounter (Signed)
First attempt, left VM.  

## 2021-06-16 NOTE — Telephone Encounter (Signed)
  Follow up Call-  Call back number 06/14/2021  Post procedure Call Back phone  # [phone]  Permission to leave phone message Yes  Some recent data might be hidden     Patient questions:  Do you have a fever, pain , or abdominal swelling? No. Pain Score  0 *  Have you tolerated food without any problems? Yes.    Have you been able to return to your normal activities? Yes.    Do you have any questions about your discharge instructions: Diet   No. Medications  No. Follow up visit  No.  Do you have questions or concerns about your Care? No.  Actions: * If pain score is 4 or above: No action needed, pain <4.  Have you developed a fever since your procedure? no  2.   Have you had an respiratory symptoms (SOB or cough) since your procedure? no  3.   Have you tested positive for COVID 19 since your procedure no  4.   Have you had any family members/close contacts diagnosed with the COVID 19 since your procedure?  no   If yes to any of these questions please route to Joylene John, RN and Joella Prince, RN

## 2021-06-22 ENCOUNTER — Encounter: Payer: Self-pay | Admitting: Gastroenterology

## 2021-06-22 ENCOUNTER — Other Ambulatory Visit: Payer: Self-pay

## 2021-06-22 MED ORDER — PROMETHAZINE HCL 12.5 MG PO TABS
ORAL_TABLET | ORAL | 1 refills | Status: AC
Start: 2021-06-22 — End: ?

## 2021-06-22 MED ORDER — DICYCLOMINE HCL 10 MG PO CAPS: 10.0000 mg | ORAL_CAPSULE | Freq: Three times a day (TID) | ORAL | 1 refills | Status: AC | PRN

## 2021-07-31 ENCOUNTER — Other Ambulatory Visit (INDEPENDENT_AMBULATORY_CARE_PROVIDER_SITE_OTHER): Payer: Medicare Other

## 2021-07-31 ENCOUNTER — Ambulatory Visit (INDEPENDENT_AMBULATORY_CARE_PROVIDER_SITE_OTHER): Payer: Medicare Other | Admitting: Gastroenterology

## 2021-07-31 ENCOUNTER — Encounter: Payer: Self-pay | Admitting: Gastroenterology

## 2021-07-31 VITALS — BP 128/70 | HR 78 | Ht 64.5 in | Wt 249.0 lb

## 2021-07-31 DIAGNOSIS — R140 Abdominal distension (gaseous): Secondary | ICD-10-CM

## 2021-07-31 DIAGNOSIS — K5904 Chronic idiopathic constipation: Secondary | ICD-10-CM | POA: Diagnosis not present

## 2021-07-31 DIAGNOSIS — R1032 Left lower quadrant pain: Secondary | ICD-10-CM | POA: Diagnosis not present

## 2021-07-31 LAB — TSH: TSH: 2.9 u[IU]/mL (ref 0.35–5.50)

## 2021-07-31 LAB — T4: T4, Total: 9.4 ug/dL (ref 5.1–11.9)

## 2021-07-31 NOTE — Progress Notes (Signed)
[facility] Progress Note  Chief Complaint: Abdominal pain and constipation  Subjective  History: Shelby Frederick follows up for her chronic digestive symptoms.  She was seen in office consult late September with several months of abdominal pain, bloating, flatulence altered bowel habits and nausea.  Course of ciprofloxacin and metronidazole given after ED visit for suspected diverticulitis (though none seen on CT scan) had not helped. EGD and colonoscopy on 06/14/2021: Normal colonoscopy to terminal ileum, biopsies negative for microscopic colitis.  Left-sided diverticulosis.  2 subcentimeter tubular adenomas, 7-year recall EGD normal 2 duodenum except 1 to 2 cm sliding hiatal hernia and widely patent Schatzki ring.  Duodenal biopsies negative for sprue.  Gastric biopsies negative for H. pylori or intestinal metaplasia. Overall picture seemed  consistent with IBS, dicyclomine prescribed.  Shelby Frederick says she is feeling about the same, no improvement with dicyclomine.  She has a BM 3-4 times a week, and it is small pellets or "flakes".  She feels bloated and uncomfortable as a result, with primarily left lower quadrant pain.  However, at times she feels the urgent need for BM in the stool may be semiformed to loose. Sounds like she gets limited dietary fiber and physical activity as well.  ROS: Cardiovascular:  no chest pain Respiratory: no dyspnea Remainder of systems negative except as above The patient's Past Medical, Family and Social History were reviewed and are on file in the EMR.  Objective:  Med list reviewed  Current Outpatient Medications:    aspirin 81 MG EC tablet, Take by mouth., Disp: , Rfl:    cyclobenzaprine (FLEXERIL) 10 MG tablet, Take 1 tablet (10 mg total) by mouth 3 (three) times daily as needed for muscle spasms., Disp: 30 tablet, Rfl: 0   dicyclomine (BENTYL) 10 MG capsule, Take 1 capsule (10 mg total) by mouth 3 (three) times daily as needed (cramps or diarrhea).,  Disp: 60 capsule, Rfl: 1   docusate sodium (COLACE) 100 MG capsule, Take 200 mg by mouth daily., Disp: , Rfl:    ibuprofen (ADVIL) 600 MG tablet, Take by mouth., Disp: , Rfl:    ondansetron (ZOFRAN) 4 MG tablet, Take 1 tablet (4 mg total) by mouth every 8 (eight) hours as needed for nausea or vomiting., Disp: 10 tablet, Rfl: 0   oxyCODONE-acetaminophen (PERCOCET) 5-325 MG tablet, Take 1 tablet by mouth every 4 (four) hours as needed (max 6 q)., Disp: 30 tablet, Rfl: 0   promethazine (PHENERGAN) 12.5 MG tablet, Take 1 tablet (12.5 mg total) by mouth up to 3 times daily for nausea and vomiting, Disp: 45 tablet, Rfl: 1   rivaroxaban (XARELTO) 20 MG TABS tablet, Take 20 mg by mouth daily with supper., Disp: , Rfl:    sertraline (ZOLOFT) 100 MG tablet, Take 100 mg by mouth daily., Disp: , Rfl:    sertraline (ZOLOFT) 100 MG tablet, Take 1 tablet by mouth daily., Disp: , Rfl:    topiramate (TOPAMAX) 100 MG tablet, Take 100 mg by mouth 2 (two) times daily., Disp: , Rfl:    Vital signs in last 24 hrs: Vitals:   07/31/21 1106  BP: 128/70  Pulse: 78   Wt Readings from Last 3 Encounters:  07/31/21 249 lb (112.9 kg)  06/14/21 253 lb (114.8 kg)  05/30/21 253 lb 6 oz (114.9 kg)    Physical Exam Shelby Frederick is with her today Pleasant and conversational, gets on exam table independently. HEENT: sclera anicteric, oral mucosa moist without lesions Neck: supple, no thyromegaly, JVD or  lymphadenopathy Cardiac: RRR without murmurs, S1S2 heard, no peripheral edema Pulm: clear to auscultation bilaterally, normal RR and effort noted Abdomen: soft, mild LLQ tenderness, with active bowel sounds.  (Limited exam for mass or hepatosplenomegaly due to body habitus) Skin; warm and dry, no jaundice or rash  Labs:   ___________________________________________ Radiologic studies:   ____________________________________________ Other:  1. Surgical [P], colon, ascending and cecum, polyp (2) - TUBULAR ADENOMA(S). -  NO HIGH GRADE DYSPLASIA OR CARCINOMA. 2. Surgical [P], random colon - UNREMARKABLE COLONIC MUCOSA. - NO MICROSCOPIC COLITIS, ACTIVE INFLAMMATION OR CHRONIC CHANGES. 3. Surgical [P], duodenal - DUODENAL MUCOSA WITH NORMAL VILLOUS ARCHITECTURE. - NO VILLOUS ATROPHY OR INCREASED INTRAEPITHELIAL LYMPHOCYTES. 4. Surgical [P], random gastric - UNREMARKABLE ANTRAL AND OXYNTIC MUCOSA. Shelby Frederick NEGATIVE FOR HELICOBACTER PYLORI. - NO INTESTINAL METAPLASIA, DYSPLASIA OR CARCINOMA. _____________________________________________ Assessment & Plan  Assessment: Encounter Diagnoses  Name Primary?   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  Curious that it seemed to occur relatively abruptly by her description. Not typical for SIBO   Plan: Trial of low-dose MiraLAX and Linzess 72 mcg once daily, call the office with an update in 2 to 3 weeks. If no improvement, trial of rifaximin 550 mg twice daily x14 days TSH and free T4 today  31 minutes were spent on this encounter (including chart review, history/exam, counseling/coordination of care, and documentation) > 50% of that time was spent on counseling and coordination of care.   Shelby Frederick III

## 2021-07-31 NOTE — Patient Instructions (Signed)
If you are age 52 or older, your body mass index should be between 23-30. Your Body mass index is 42.08 kg/m. If this is out of the aforementioned range listed, please consider follow up with your Primary Care Provider.  If you are age 62 or younger, your body mass index should be between 19-25. Your Body mass index is 42.08 kg/m. If this is out of the aformentioned range listed, please consider follow up with your Primary Care Provider.   ________________________________________________________  The [facility] providers would like to encourage you to use [facility] to communicate with providers for non-urgent requests or questions.  Due to long hold times on the telephone, sending your provider a message by [facility] may be a faster and more efficient way to get a response.  Please allow 48 business hours for a response.  Please remember that this is for non-urgent requests.  _______________________________________________________  Medication Samples have been provided to the patient.  Drug name: Linzess       Strength: 36mch         Qty: 12  LOT: [id number]  Exp.Date: 08-2022  Dosing instructions: one a day  Start Miralax 1/2 capful in 8 oz of water or juice.  The patient has been instructed regarding the correct time, dose, and frequency of taking this medication, including desired effects and most common side effects.    Your provider has requested that you go to the basement level for lab work before leaving today. Press "B" on the elevator. The lab is located at the first door on the left as you exit the elevator.  It was a pleasure to see you today!  Thank you for trusting me with your gastrointestinal care!
